# Patient Record
Sex: Male | Born: 1990
Health system: Southern US, Community
[De-identification: ages and names within clinical notes are randomized; demographics above are authoritative.]

## PROBLEM LIST (undated history)

## (undated) DIAGNOSIS — I10 Essential (primary) hypertension: Secondary | ICD-10-CM

## (undated) DIAGNOSIS — N390 Urinary tract infection, site not specified: Secondary | ICD-10-CM

## (undated) DIAGNOSIS — E119 Type 2 diabetes mellitus without complications: Secondary | ICD-10-CM

## (undated) DIAGNOSIS — W3400XA Accidental discharge from unspecified firearms or gun, initial encounter: Secondary | ICD-10-CM

## (undated) DIAGNOSIS — M24411 Recurrent dislocation, right shoulder: Secondary | ICD-10-CM

---
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
## 2011-04-15 ENCOUNTER — Emergency Department (HOSPITAL_COMMUNITY)
Admission: EM | Admit: 2011-04-15 | Discharge: 2011-04-15 | Disposition: A | Discharge status: Home / Self Care | Payer: No Typology Code available for payment source | Attending: Endocrinology | Admitting: Endocrinology

## 2011-04-15 DIAGNOSIS — S139XXA Sprain of joints and ligaments of unspecified parts of neck, initial encounter: Secondary | ICD-10-CM | POA: Insufficient documentation

## 2011-04-15 DIAGNOSIS — M549 Dorsalgia, unspecified: Secondary | ICD-10-CM | POA: Insufficient documentation

## 2011-04-15 DIAGNOSIS — M542 Cervicalgia: Secondary | ICD-10-CM | POA: Insufficient documentation

## 2012-08-31 ENCOUNTER — Emergency Department (HOSPITAL_COMMUNITY): Payer: Self-pay

## 2012-08-31 ENCOUNTER — Emergency Department (HOSPITAL_COMMUNITY)
Admission: EM | Admit: 2012-08-31 | Discharge: 2012-08-31 | Disposition: A | Discharge status: Home / Self Care | Payer: Self-pay | Attending: Emergency Medicine | Admitting: Emergency Medicine

## 2012-08-31 ENCOUNTER — Encounter (HOSPITAL_COMMUNITY): Payer: Self-pay | Admitting: Emergency Medicine

## 2012-08-31 DIAGNOSIS — S43006A Unspecified dislocation of unspecified shoulder joint, initial encounter: Secondary | ICD-10-CM

## 2012-08-31 DIAGNOSIS — F17200 Nicotine dependence, unspecified, uncomplicated: Secondary | ICD-10-CM | POA: Insufficient documentation

## 2012-08-31 DIAGNOSIS — Y9302 Activity, running: Secondary | ICD-10-CM | POA: Insufficient documentation

## 2012-08-31 DIAGNOSIS — Y929 Unspecified place or not applicable: Secondary | ICD-10-CM | POA: Insufficient documentation

## 2012-08-31 DIAGNOSIS — S00511A Abrasion of lip, initial encounter: Secondary | ICD-10-CM

## 2012-08-31 DIAGNOSIS — IMO0002 Reserved for concepts with insufficient information to code with codable children: Secondary | ICD-10-CM | POA: Insufficient documentation

## 2012-08-31 DIAGNOSIS — S4980XA Other specified injuries of shoulder and upper arm, unspecified arm, initial encounter: Secondary | ICD-10-CM | POA: Insufficient documentation

## 2012-08-31 DIAGNOSIS — S46909A Unspecified injury of unspecified muscle, fascia and tendon at shoulder and upper arm level, unspecified arm, initial encounter: Secondary | ICD-10-CM | POA: Insufficient documentation

## 2012-08-31 DIAGNOSIS — W1809XA Striking against other object with subsequent fall, initial encounter: Secondary | ICD-10-CM | POA: Insufficient documentation

## 2012-08-31 MED ORDER — PROPOFOL 10 MG/ML IV BOLUS
INTRAVENOUS | Status: AC | PRN
  Administered 2012-08-31: 40 mg via INTRAVENOUS
  Administered 2012-08-31: 90 mg via INTRAVENOUS

## 2012-08-31 MED ORDER — PROPOFOL 10 MG/ML IV BOLUS: 40.0000 mg | Freq: Once | INTRAVENOUS | Status: DC

## 2012-08-31 MED ORDER — HYDROCODONE-ACETAMINOPHEN 5-325 MG PO TABS: 2.0000 | ORAL_TABLET | ORAL | Status: DC | PRN

## 2012-08-31 MED ORDER — PROPOFOL 10 MG/ML IV BOLUS
1.0000 mg/kg | Freq: Once | INTRAVENOUS | Status: DC
  Filled 2012-08-31: qty 1

## 2012-08-31 MED ORDER — SODIUM CHLORIDE 0.9 % IV SOLN
INTRAVENOUS | Status: AC | PRN
  Administered 2012-08-31: 100 mL/h via INTRAVENOUS

## 2012-08-31 MED ORDER — FENTANYL CITRATE 0.05 MG/ML IJ SOLN
50.0000 ug | Freq: Once | INTRAMUSCULAR | Status: DC
  Filled 2012-08-31: qty 2
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
## 2012-10-04 ENCOUNTER — Emergency Department (HOSPITAL_COMMUNITY): Payer: Self-pay

## 2012-10-04 ENCOUNTER — Emergency Department (HOSPITAL_COMMUNITY)
Admission: EM | Admit: 2012-10-04 | Discharge: 2012-10-05 | Discharge status: Against Medical Advice | Payer: Self-pay | Attending: Emergency Medicine | Admitting: Emergency Medicine

## 2012-10-04 ENCOUNTER — Encounter (HOSPITAL_COMMUNITY): Payer: Self-pay | Admitting: Emergency Medicine

## 2012-10-04 DIAGNOSIS — Y9361 Activity, american tackle football: Secondary | ICD-10-CM | POA: Insufficient documentation

## 2012-10-04 DIAGNOSIS — F17200 Nicotine dependence, unspecified, uncomplicated: Secondary | ICD-10-CM | POA: Insufficient documentation

## 2012-10-04 DIAGNOSIS — S43006A Unspecified dislocation of unspecified shoulder joint, initial encounter: Secondary | ICD-10-CM | POA: Insufficient documentation

## 2012-10-04 DIAGNOSIS — X503XXA Overexertion from repetitive movements, initial encounter: Secondary | ICD-10-CM | POA: Insufficient documentation

## 2012-10-04 DIAGNOSIS — Z87828 Personal history of other (healed) physical injury and trauma: Secondary | ICD-10-CM | POA: Insufficient documentation

## 2012-10-04 DIAGNOSIS — I10 Essential (primary) hypertension: Secondary | ICD-10-CM | POA: Insufficient documentation

## 2012-10-04 DIAGNOSIS — M24411 Recurrent dislocation, right shoulder: Secondary | ICD-10-CM

## 2012-10-04 DIAGNOSIS — Y929 Unspecified place or not applicable: Secondary | ICD-10-CM | POA: Insufficient documentation

## 2012-10-04 HISTORY — DX: Essential (primary) hypertension: I10

## 2012-10-04 HISTORY — DX: Recurrent dislocation, right shoulder: M24.411

## 2012-10-04 NOTE — ED Notes (Signed)
Pt c/o of 8/10 right shoulder pain. Pt states that he has a history of shoulder dislocation and states it feels the same as the last time it came out. Upon standing, right arm hangs lower than the left. Right arm capillary refill is <3 seconds, ulnar and radial pulses are strong.

## 2012-10-05 ENCOUNTER — Encounter (HOSPITAL_COMMUNITY): Payer: Self-pay | Admitting: Emergency Medicine

## 2012-10-05 ENCOUNTER — Emergency Department (HOSPITAL_COMMUNITY): Payer: Self-pay

## 2012-10-05 MED ORDER — FENTANYL CITRATE 0.05 MG/ML IJ SOLN
50.0000 ug | INTRAMUSCULAR | Status: DC | PRN
  Administered 2012-10-05: 50 ug via INTRAVENOUS
  Filled 2012-10-05: qty 2

## 2012-10-05 MED ORDER — PROPOFOL 10 MG/ML IV BOLUS
0.5000 mg/kg | Freq: Once | INTRAVENOUS | Status: AC
  Administered 2012-10-05: 200 mg via INTRAVENOUS

## 2012-10-05 MED ORDER — SODIUM CHLORIDE 0.9 % IV SOLN: INTRAVENOUS | Status: DC

## 2012-10-05 MED ORDER — ONDANSETRON HCL 4 MG/2ML IJ SOLN
4.0000 mg | INTRAMUSCULAR | Status: DC | PRN
  Administered 2012-10-05: 4 mg via INTRAVENOUS
  Filled 2012-10-05: qty 2

## 2012-10-05 MED ORDER — PROPOFOL 10 MG/ML IV BOLUS
INTRAVENOUS | Status: AC
  Filled 2012-10-05: qty 1
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
## 2012-10-05 NOTE — ED Notes (Signed)
Pt not in room, gown laying on the bed; IV tubing and IV cath laying on bed; pt not in department or in the waiting room.

## 2013-01-11 ENCOUNTER — Emergency Department (HOSPITAL_COMMUNITY): Payer: BC Managed Care – PPO

## 2013-01-11 ENCOUNTER — Encounter (HOSPITAL_COMMUNITY): Payer: Self-pay | Admitting: Emergency Medicine

## 2013-01-11 ENCOUNTER — Emergency Department (HOSPITAL_COMMUNITY)
Admission: EM | Admit: 2013-01-11 | Discharge: 2013-01-11 | Disposition: A | Discharge status: Home / Self Care | Payer: BC Managed Care – PPO | Attending: Emergency Medicine | Admitting: Emergency Medicine

## 2013-01-11 DIAGNOSIS — Z8739 Personal history of other diseases of the musculoskeletal system and connective tissue: Secondary | ICD-10-CM | POA: Insufficient documentation

## 2013-01-11 DIAGNOSIS — IMO0001 Reserved for inherently not codable concepts without codable children: Secondary | ICD-10-CM | POA: Insufficient documentation

## 2013-01-11 DIAGNOSIS — S93409A Sprain of unspecified ligament of unspecified ankle, initial encounter: Secondary | ICD-10-CM | POA: Insufficient documentation

## 2013-01-11 DIAGNOSIS — Y92410 Unspecified street and highway as the place of occurrence of the external cause: Secondary | ICD-10-CM | POA: Insufficient documentation

## 2013-01-11 DIAGNOSIS — IMO0002 Reserved for concepts with insufficient information to code with codable children: Secondary | ICD-10-CM | POA: Insufficient documentation

## 2013-01-11 DIAGNOSIS — M549 Dorsalgia, unspecified: Secondary | ICD-10-CM

## 2013-01-11 DIAGNOSIS — I10 Essential (primary) hypertension: Secondary | ICD-10-CM | POA: Insufficient documentation

## 2013-01-11 DIAGNOSIS — F17200 Nicotine dependence, unspecified, uncomplicated: Secondary | ICD-10-CM | POA: Insufficient documentation

## 2013-01-11 DIAGNOSIS — Y939 Activity, unspecified: Secondary | ICD-10-CM | POA: Insufficient documentation

## 2013-01-11 MED ORDER — HYDROCODONE-ACETAMINOPHEN 5-325 MG PO TABS: 1.0000 | ORAL_TABLET | ORAL | Status: DC | PRN

## 2013-01-11 MED ORDER — HYDROCODONE-ACETAMINOPHEN 5-325 MG PO TABS
1.0000 | ORAL_TABLET | Freq: Once | ORAL | Status: AC
  Administered 2013-01-11: 1 via ORAL
  Filled 2013-01-11: qty 1

## 2013-01-11 NOTE — ED Notes (Signed)
Patient transported to X-ray 
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
## 2013-01-12 NOTE — ED Provider Notes (Signed)
Medical screening examination/treatment/procedure(s) were performed by non-physician practitioner and as supervising physician I was immediately available for consultation/collaboration.  Geoffery Lyons, MD 01/12/13 [phone]

## 2014-05-13 ENCOUNTER — Emergency Department (HOSPITAL_COMMUNITY): Payer: BC Managed Care – PPO

## 2014-05-13 ENCOUNTER — Emergency Department (HOSPITAL_COMMUNITY)
Admission: EM | Admit: 2014-05-13 | Discharge: 2014-05-14 | Disposition: A | Discharge status: Home / Self Care | Payer: BC Managed Care – PPO | Attending: Emergency Medicine | Admitting: Emergency Medicine

## 2014-05-13 ENCOUNTER — Encounter (HOSPITAL_COMMUNITY): Payer: Self-pay | Admitting: Emergency Medicine

## 2014-05-13 DIAGNOSIS — S199XXA Unspecified injury of neck, initial encounter: Secondary | ICD-10-CM | POA: Diagnosis not present

## 2014-05-13 DIAGNOSIS — F1410 Cocaine abuse, uncomplicated: Secondary | ICD-10-CM | POA: Diagnosis not present

## 2014-05-13 DIAGNOSIS — Z8739 Personal history of other diseases of the musculoskeletal system and connective tissue: Secondary | ICD-10-CM | POA: Diagnosis not present

## 2014-05-13 DIAGNOSIS — F1010 Alcohol abuse, uncomplicated: Secondary | ICD-10-CM | POA: Diagnosis not present

## 2014-05-13 DIAGNOSIS — F1210 Cannabis abuse, uncomplicated: Secondary | ICD-10-CM | POA: Insufficient documentation

## 2014-05-13 DIAGNOSIS — I10 Essential (primary) hypertension: Secondary | ICD-10-CM | POA: Diagnosis not present

## 2014-05-13 DIAGNOSIS — F17200 Nicotine dependence, unspecified, uncomplicated: Secondary | ICD-10-CM | POA: Diagnosis not present

## 2014-05-13 DIAGNOSIS — F10920 Alcohol use, unspecified with intoxication, uncomplicated: Secondary | ICD-10-CM

## 2014-05-13 DIAGNOSIS — IMO0002 Reserved for concepts with insufficient information to code with codable children: Secondary | ICD-10-CM | POA: Insufficient documentation

## 2014-05-13 DIAGNOSIS — N179 Acute kidney failure, unspecified: Secondary | ICD-10-CM | POA: Diagnosis not present

## 2014-05-13 DIAGNOSIS — Y92410 Unspecified street and highway as the place of occurrence of the external cause: Secondary | ICD-10-CM | POA: Diagnosis not present

## 2014-05-13 DIAGNOSIS — S0993XA Unspecified injury of face, initial encounter: Secondary | ICD-10-CM | POA: Diagnosis not present

## 2014-05-13 DIAGNOSIS — R404 Transient alteration of awareness: Secondary | ICD-10-CM | POA: Insufficient documentation

## 2014-05-13 DIAGNOSIS — Y9389 Activity, other specified: Secondary | ICD-10-CM | POA: Diagnosis not present

## 2014-05-13 DIAGNOSIS — F1910 Other psychoactive substance abuse, uncomplicated: Secondary | ICD-10-CM

## 2014-05-13 DIAGNOSIS — S0990XA Unspecified injury of head, initial encounter: Secondary | ICD-10-CM | POA: Diagnosis present

## 2014-05-13 DIAGNOSIS — R000 Tachycardia, unspecified: Secondary | ICD-10-CM | POA: Insufficient documentation

## 2014-05-13 DIAGNOSIS — T1490XA Injury, unspecified, initial encounter: Secondary | ICD-10-CM

## 2014-05-13 DIAGNOSIS — S0091XA Abrasion of unspecified part of head, initial encounter: Secondary | ICD-10-CM

## 2014-05-13 LAB — CBC
HEMATOCRIT: 45.3 % (ref 39.0–52.0)
Hemoglobin: 15.4 g/dL (ref 13.0–17.0)
MCH: 30.7 pg (ref 26.0–34.0)
MCHC: 34 g/dL (ref 30.0–36.0)
MCV: 90.2 fL (ref 78.0–100.0)
Platelets: 234 10*3/uL (ref 150–400)
RBC: 5.02 MIL/uL (ref 4.22–5.81)
RDW: 13.2 % (ref 11.5–15.5)
WBC: 5.1 10*3/uL (ref 4.0–10.5)

## 2014-05-13 LAB — SAMPLE TO BLOOD BANK

## 2014-05-13 LAB — COMPREHENSIVE METABOLIC PANEL
ALBUMIN: 3.8 g/dL (ref 3.5–5.2)
ALK PHOS: 58 U/L (ref 39–117)
ALT: 42 U/L (ref 0–53)
AST: 69 U/L — ABNORMAL HIGH (ref 0–37)
Anion gap: 13 (ref 5–15)
BUN: 7 mg/dL (ref 6–23)
CO2: 23 mEq/L (ref 19–32)
Calcium: 9.3 mg/dL (ref 8.4–10.5)
Chloride: 108 mEq/L (ref 96–112)
Creatinine, Ser: 1.42 mg/dL — ABNORMAL HIGH (ref 0.50–1.35)
GFR calc Af Amer: 80 mL/min — ABNORMAL LOW (ref >90)
GFR calc non Af Amer: 69 mL/min — ABNORMAL LOW (ref >90)
Glucose, Bld: 100 mg/dL — ABNORMAL HIGH (ref 70–99)
POTASSIUM: 3.9 meq/L (ref 3.7–5.3)
Sodium: 144 mEq/L (ref 137–147)
TOTAL PROTEIN: 7.3 g/dL (ref 6.0–8.3)
Total Bilirubin: 0.2 mg/dL — ABNORMAL LOW (ref 0.3–1.2)

## 2014-05-13 LAB — RAPID URINE DRUG SCREEN, HOSP PERFORMED
Amphetamines: NOT DETECTED
Barbiturates: NOT DETECTED
Benzodiazepines: NOT DETECTED
Cocaine: POSITIVE — AB
Opiates: NOT DETECTED
Tetrahydrocannabinol: POSITIVE — AB

## 2014-05-13 LAB — PROTIME-INR
INR: 0.96 (ref 0.00–1.49)
Prothrombin Time: 12.8 seconds (ref 11.6–15.2)

## 2014-05-13 LAB — I-STAT CG4 LACTIC ACID, ED: LACTIC ACID, VENOUS: 1.41 mmol/L (ref 0.5–2.2)

## 2014-05-13 LAB — LIPASE, BLOOD: LIPASE: 38 U/L (ref 11–59)

## 2014-05-13 LAB — ETHANOL: Alcohol, Ethyl (B): 136 mg/dL — ABNORMAL HIGH (ref 0–11)

## 2014-05-13 MED ORDER — SODIUM CHLORIDE 0.9 % IV BOLUS (SEPSIS)
1000.0000 mL | Freq: Once | INTRAVENOUS | Status: AC
  Administered 2014-05-13: 1000 mL via INTRAVENOUS

## 2014-05-13 MED ORDER — KETOROLAC TROMETHAMINE 30 MG/ML IJ SOLN
30.0000 mg | Freq: Once | INTRAMUSCULAR | Status: DC
  Filled 2014-05-13: qty 1
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
## 2014-05-13 NOTE — Discharge Instructions (Signed)
Alcohol Intoxication °Alcohol intoxication occurs when you drink enough alcohol that it affects your ability to function. It can be mild or very severe. Drinking a lot of alcohol in a short time is called binge drinking. This can be very harmful. Drinking alcohol can also be more dangerous if you are taking medicines or other drugs. Some of the effects caused by alcohol may include: °· Loss of coordination. °· Changes in mood and behavior. °· Unclear thinking. °· Trouble talking (slurred speech). °· Throwing up (vomiting). °· Confusion. °· Slowed breathing. °· Twitching and shaking (seizures). °· Loss of consciousness. °HOME CARE °· Do not drive after drinking alcohol. °· Drink enough water and fluids to keep your pee (urine) clear or pale yellow. Avoid caffeine. °· Only take medicine as told by your doctor. °GET HELP IF: °· You throw up (vomit) many times. °· You do not feel better after a few days. °· You frequently have alcohol intoxication. Your doctor can help decide if you should see a substance use treatment counselor. °GET HELP RIGHT AWAY IF: °· You become shaky when you stop drinking. °· You have twitching and shaking. °· You throw up blood. It may look bright red or like coffee grounds. °· You notice blood in your poop (bowel movements). °· You become lightheaded or pass out (faint). °MAKE SURE YOU:  °· Understand these instructions. °· Will watch your condition. °· Will get help right away if you are not doing well or get worse. °Document Released: 02/07/2008 Document Revised: 04/23/2013 Document Reviewed: 01/24/2013 °[employer]® Patient Information ©2015 [employer]. This information is not intended to replace advice given to you by your health care provider. Make sure you discuss any questions you have with your health care provider. ° °
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
## 2014-05-13 NOTE — ED Notes (Addendum)
This RN went into the room with Craig Circle, MD for assessment of the pt and to update the family. Pt hard to rouse. When his name called and shaken, pt will grumble and roll over. Painful stimuli applied to have the pt be fully alert to answer questions.
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
## 2014-05-14 NOTE — ED Notes (Signed)
Pt's sister to come pick him up. Pt to stay in room until her arrival. Pt is alert and oriented at this time, and is aware of the plan.

## 2017-01-16 ENCOUNTER — Emergency Department (HOSPITAL_COMMUNITY): Payer: Medicaid Other

## 2017-01-16 ENCOUNTER — Inpatient Hospital Stay (HOSPITAL_COMMUNITY)
Admission: EM | Admit: 2017-01-16 | Discharge: 2017-01-29 | DRG: 957 | Disposition: A | Discharge status: Rehabilitation Facility | Payer: Medicaid Other | Attending: General Surgery | Admitting: General Surgery

## 2017-01-16 ENCOUNTER — Encounter (HOSPITAL_COMMUNITY)
Admission: EM | Disposition: A | Discharge status: Rehabilitation Facility | Payer: Self-pay | Source: Home / Self Care | Attending: Urology

## 2017-01-16 ENCOUNTER — Emergency Department (HOSPITAL_COMMUNITY): Payer: Medicaid Other | Admitting: Certified Registered Nurse Anesthetist

## 2017-01-16 DIAGNOSIS — J9600 Acute respiratory failure, unspecified whether with hypoxia or hypercapnia: Secondary | ICD-10-CM | POA: Diagnosis present

## 2017-01-16 DIAGNOSIS — S36039A Unspecified laceration of spleen, initial encounter: Secondary | ICD-10-CM | POA: Diagnosis present

## 2017-01-16 DIAGNOSIS — D62 Acute posthemorrhagic anemia: Secondary | ICD-10-CM | POA: Diagnosis present

## 2017-01-16 DIAGNOSIS — S24104A Unspecified injury at T11-T12 level of thoracic spinal cord, initial encounter: Secondary | ICD-10-CM | POA: Diagnosis present

## 2017-01-16 DIAGNOSIS — R579 Shock, unspecified: Secondary | ICD-10-CM | POA: Diagnosis present

## 2017-01-16 DIAGNOSIS — F17210 Nicotine dependence, cigarettes, uncomplicated: Secondary | ICD-10-CM | POA: Diagnosis present

## 2017-01-16 DIAGNOSIS — G8220 Paraplegia, unspecified: Secondary | ICD-10-CM | POA: Diagnosis present

## 2017-01-16 DIAGNOSIS — E46 Unspecified protein-calorie malnutrition: Secondary | ICD-10-CM | POA: Diagnosis present

## 2017-01-16 DIAGNOSIS — R7989 Other specified abnormal findings of blood chemistry: Secondary | ICD-10-CM | POA: Diagnosis present

## 2017-01-16 DIAGNOSIS — K5900 Constipation, unspecified: Secondary | ICD-10-CM | POA: Diagnosis not present

## 2017-01-16 DIAGNOSIS — G8100 Flaccid hemiplegia affecting unspecified side: Secondary | ICD-10-CM

## 2017-01-16 DIAGNOSIS — N179 Acute kidney failure, unspecified: Secondary | ICD-10-CM | POA: Diagnosis present

## 2017-01-16 DIAGNOSIS — S299XXA Unspecified injury of thorax, initial encounter: Secondary | ICD-10-CM

## 2017-01-16 DIAGNOSIS — R05 Cough: Secondary | ICD-10-CM

## 2017-01-16 DIAGNOSIS — S36113A Laceration of liver, unspecified degree, initial encounter: Secondary | ICD-10-CM | POA: Diagnosis present

## 2017-01-16 DIAGNOSIS — F329 Major depressive disorder, single episode, unspecified: Secondary | ICD-10-CM | POA: Diagnosis present

## 2017-01-16 DIAGNOSIS — S41031A Puncture wound without foreign body of right shoulder, initial encounter: Secondary | ICD-10-CM | POA: Diagnosis present

## 2017-01-16 DIAGNOSIS — Z6830 Body mass index (BMI) 30.0-30.9, adult: Secondary | ICD-10-CM

## 2017-01-16 DIAGNOSIS — S24109A Unspecified injury at unspecified level of thoracic spinal cord, initial encounter: Secondary | ICD-10-CM | POA: Diagnosis present

## 2017-01-16 DIAGNOSIS — S2241XA Multiple fractures of ribs, right side, initial encounter for closed fracture: Secondary | ICD-10-CM | POA: Diagnosis present

## 2017-01-16 DIAGNOSIS — S37032A Laceration of left kidney, unspecified degree, initial encounter: Secondary | ICD-10-CM | POA: Diagnosis present

## 2017-01-16 DIAGNOSIS — S37012A Minor contusion of left kidney, initial encounter: Secondary | ICD-10-CM | POA: Diagnosis present

## 2017-01-16 DIAGNOSIS — E871 Hypo-osmolality and hyponatremia: Secondary | ICD-10-CM | POA: Diagnosis present

## 2017-01-16 DIAGNOSIS — Y906 Blood alcohol level of 120-199 mg/100 ml: Secondary | ICD-10-CM | POA: Diagnosis present

## 2017-01-16 DIAGNOSIS — N319 Neuromuscular dysfunction of bladder, unspecified: Secondary | ICD-10-CM | POA: Diagnosis present

## 2017-01-16 DIAGNOSIS — M792 Neuralgia and neuritis, unspecified: Secondary | ICD-10-CM

## 2017-01-16 DIAGNOSIS — W3400XA Accidental discharge from unspecified firearms or gun, initial encounter: Secondary | ICD-10-CM | POA: Diagnosis not present

## 2017-01-16 DIAGNOSIS — S31139A Puncture wound of abdominal wall without foreign body, unspecified quadrant without penetration into peritoneal cavity, initial encounter: Secondary | ICD-10-CM | POA: Diagnosis present

## 2017-01-16 DIAGNOSIS — J90 Pleural effusion, not elsewhere classified: Secondary | ICD-10-CM | POA: Diagnosis present

## 2017-01-16 DIAGNOSIS — S41032A Puncture wound without foreign body of left shoulder, initial encounter: Secondary | ICD-10-CM | POA: Diagnosis present

## 2017-01-16 DIAGNOSIS — S31609A Unspecified open wound of abdominal wall, unspecified quadrant with penetration into peritoneal cavity, initial encounter: Secondary | ICD-10-CM | POA: Diagnosis present

## 2017-01-16 DIAGNOSIS — J942 Hemothorax: Secondary | ICD-10-CM | POA: Diagnosis present

## 2017-01-16 DIAGNOSIS — R390 Extravasation of urine: Secondary | ICD-10-CM | POA: Diagnosis present

## 2017-01-16 DIAGNOSIS — J9383 Other pneumothorax: Secondary | ICD-10-CM | POA: Diagnosis present

## 2017-01-16 DIAGNOSIS — S2232XA Fracture of one rib, left side, initial encounter for closed fracture: Secondary | ICD-10-CM | POA: Diagnosis present

## 2017-01-16 DIAGNOSIS — R079 Chest pain, unspecified: Secondary | ICD-10-CM | POA: Diagnosis present

## 2017-01-16 DIAGNOSIS — J939 Pneumothorax, unspecified: Secondary | ICD-10-CM

## 2017-01-16 DIAGNOSIS — R059 Cough, unspecified: Secondary | ICD-10-CM

## 2017-01-16 DIAGNOSIS — Z9889 Other specified postprocedural states: Secondary | ICD-10-CM

## 2017-01-16 DIAGNOSIS — Z978 Presence of other specified devices: Secondary | ICD-10-CM

## 2017-01-16 DIAGNOSIS — S42111A Displaced fracture of body of scapula, right shoulder, initial encounter for closed fracture: Secondary | ICD-10-CM | POA: Diagnosis present

## 2017-01-16 DIAGNOSIS — G629 Polyneuropathy, unspecified: Secondary | ICD-10-CM | POA: Diagnosis present

## 2017-01-16 DIAGNOSIS — S37001S Unspecified injury of right kidney, sequela: Secondary | ICD-10-CM

## 2017-01-16 DIAGNOSIS — G8918 Other acute postprocedural pain: Secondary | ICD-10-CM

## 2017-01-16 DIAGNOSIS — F1010 Alcohol abuse, uncomplicated: Secondary | ICD-10-CM | POA: Diagnosis present

## 2017-01-16 DIAGNOSIS — E8809 Other disorders of plasma-protein metabolism, not elsewhere classified: Secondary | ICD-10-CM

## 2017-01-16 DIAGNOSIS — G8389 Other specified paralytic syndromes: Secondary | ICD-10-CM

## 2017-01-16 DIAGNOSIS — D72829 Elevated white blood cell count, unspecified: Secondary | ICD-10-CM | POA: Diagnosis present

## 2017-01-16 DIAGNOSIS — R310 Gross hematuria: Secondary | ICD-10-CM | POA: Diagnosis present

## 2017-01-16 HISTORY — PX: LAPAROTOMY: SHX154

## 2017-01-16 LAB — I-STAT CHEM 8, ED
BUN: 9 mg/dL (ref 6–20)
Calcium, Ion: 1.09 mmol/L — ABNORMAL LOW (ref 1.15–1.40)
Chloride: 110 mmol/L (ref 101–111)
Creatinine, Ser: 1.9 mg/dL — ABNORMAL HIGH (ref 0.61–1.24)
Glucose, Bld: 118 mg/dL — ABNORMAL HIGH (ref 65–99)
HCT: 43 % (ref 39.0–52.0)
Hemoglobin: 14.6 g/dL (ref 13.0–17.0)
Potassium: 3.6 mmol/L (ref 3.5–5.1)
SODIUM: 146 mmol/L — AB (ref 135–145)
TCO2: 20 mmol/L (ref 0–100)

## 2017-01-16 LAB — CBC
HCT: 41.6 % (ref 39.0–52.0)
HEMATOCRIT: 40.7 % (ref 39.0–52.0)
HEMATOCRIT: 35.8 % — AB (ref 39.0–52.0)
HEMOGLOBIN: 13.8 g/dL (ref 13.0–17.0)
Hemoglobin: 13.2 g/dL (ref 13.0–17.0)
Hemoglobin: 11.7 g/dL — ABNORMAL LOW (ref 13.0–17.0)
MCH: 30.5 pg (ref 26.0–34.0)
MCH: 29.8 pg (ref 26.0–34.0)
MCH: 29.3 pg (ref 26.0–34.0)
MCHC: 33.2 g/dL (ref 30.0–36.0)
MCHC: 32.4 g/dL (ref 30.0–36.0)
MCHC: 32.7 g/dL (ref 30.0–36.0)
MCV: 91.8 fL (ref 78.0–100.0)
MCV: 91.9 fL (ref 78.0–100.0)
MCV: 89.5 fL (ref 78.0–100.0)
PLATELETS: 253 10*3/uL (ref 150–400)
PLATELETS: 193 10*3/uL (ref 150–400)
Platelets: 195 10*3/uL (ref 150–400)
RBC: 4.53 MIL/uL (ref 4.22–5.81)
RBC: 4.43 MIL/uL (ref 4.22–5.81)
RBC: 4 MIL/uL — AB (ref 4.22–5.81)
RDW: 14.8 % (ref 11.5–15.5)
RDW: 15.2 % (ref 11.5–15.5)
RDW: 15.1 % (ref 11.5–15.5)
WBC: 6.1 10*3/uL (ref 4.0–10.5)
WBC: 26.1 10*3/uL — AB (ref 4.0–10.5)
WBC: 14.3 10*3/uL — AB (ref 4.0–10.5)

## 2017-01-16 LAB — COMPREHENSIVE METABOLIC PANEL
ALBUMIN: 2.7 g/dL — AB (ref 3.5–5.0)
ALK PHOS: 60 U/L (ref 38–126)
ALT: 71 U/L — ABNORMAL HIGH (ref 17–63)
ALT: 111 U/L — AB (ref 17–63)
ANION GAP: 12 (ref 5–15)
AST: 88 U/L — ABNORMAL HIGH (ref 15–41)
AST: 145 U/L — AB (ref 15–41)
Albumin: 3.7 g/dL (ref 3.5–5.0)
Alkaline Phosphatase: 45 U/L (ref 38–126)
Anion gap: 8 (ref 5–15)
BILIRUBIN TOTAL: 0.5 mg/dL (ref 0.3–1.2)
BUN: 9 mg/dL (ref 6–20)
BUN: 10 mg/dL (ref 6–20)
CALCIUM: 8.6 mg/dL — AB (ref 8.9–10.3)
CHLORIDE: 115 mmol/L — AB (ref 101–111)
CO2: 18 mmol/L — ABNORMAL LOW (ref 22–32)
CO2: 16 mmol/L — ABNORMAL LOW (ref 22–32)
CREATININE: 1.73 mg/dL — AB (ref 0.61–1.24)
CREATININE: 1.37 mg/dL — AB (ref 0.61–1.24)
Calcium: 7 mg/dL — ABNORMAL LOW (ref 8.9–10.3)
Chloride: 112 mmol/L — ABNORMAL HIGH (ref 101–111)
GFR calc Af Amer: >60 mL/min (ref >60)
GFR calc Af Amer: >60 mL/min (ref >60)
GFR calc non Af Amer: 53 mL/min — ABNORMAL LOW (ref >60)
GLUCOSE: 126 mg/dL — AB (ref 65–99)
Glucose, Bld: 110 mg/dL — ABNORMAL HIGH (ref 65–99)
POTASSIUM: 5.6 mmol/L — AB (ref 3.5–5.1)
Potassium: 3.5 mmol/L (ref 3.5–5.1)
Sodium: 142 mmol/L (ref 135–145)
Sodium: 139 mmol/L (ref 135–145)
TOTAL PROTEIN: 6.4 g/dL — AB (ref 6.5–8.1)
Total Bilirubin: 0.8 mg/dL (ref 0.3–1.2)
Total Protein: 4.7 g/dL — ABNORMAL LOW (ref 6.5–8.1)

## 2017-01-16 LAB — BPAM PLATELET PHERESIS
BLOOD PRODUCT EXPIRATION DATE: 201805172359
ISSUE DATE / TIME: 201805151828
Unit Type and Rh: 5100

## 2017-01-16 LAB — POCT I-STAT 3, ART BLOOD GAS (G3+)
Acid-base deficit: 8 mmol/L — ABNORMAL HIGH (ref 0.0–2.0)
BICARBONATE: 17.4 mmol/L — AB (ref 20.0–28.0)
O2 Saturation: 100 %
PCO2 ART: 35.2 mmHg (ref 32.0–48.0)
PH ART: 7.302 — AB (ref 7.350–7.450)
TCO2: 18 mmol/L (ref 0–100)
pO2, Arterial: 409 mmHg — ABNORMAL HIGH (ref 83.0–108.0)

## 2017-01-16 LAB — PROTIME-INR
INR: 1.11
INR: 1.24
Prothrombin Time: 14.3 seconds (ref 11.4–15.2)
Prothrombin Time: 15.7 seconds — ABNORMAL HIGH (ref 11.4–15.2)

## 2017-01-16 LAB — I-STAT CG4 LACTIC ACID, ED: LACTIC ACID, VENOUS: 4.14 mmol/L — AB (ref 0.5–1.9)

## 2017-01-16 LAB — CDS SEROLOGY

## 2017-01-16 LAB — PREPARE PLATELET PHERESIS: Unit division: 0

## 2017-01-16 LAB — FIBRINOGEN: Fibrinogen: 232 mg/dL (ref 210–475)

## 2017-01-16 LAB — ETHANOL: ALCOHOL ETHYL (B): 141 mg/dL — AB (ref <5)

## 2017-01-16 LAB — LACTIC ACID, PLASMA: Lactic Acid, Venous: 2.9 mmol/L (ref 0.5–1.9)

## 2017-01-16 LAB — GLUCOSE, CAPILLARY: Glucose-Capillary: 113 mg/dL — ABNORMAL HIGH (ref 65–99)

## 2017-01-16 LAB — MRSA PCR SCREENING: MRSA by PCR: NEGATIVE

## 2017-01-16 LAB — ABO/RH: ABO/RH(D): A POS

## 2017-01-16 LAB — TRIGLYCERIDES: TRIGLYCERIDES: 199 mg/dL — AB (ref <150)

## 2017-01-16 SURGERY — LAPAROTOMY, EXPLORATORY
Anesthesia: General | Site: Abdomen

## 2017-01-16 MED ORDER — 0.9 % SODIUM CHLORIDE (POUR BTL) OPTIME
TOPICAL | Status: DC | PRN
  Administered 2017-01-16 (×6): 1000 mL

## 2017-01-16 MED ORDER — DOCUSATE SODIUM 50 MG/5ML PO LIQD: 100.0000 mg | Freq: Two times a day (BID) | ORAL | Status: DC | PRN

## 2017-01-16 MED ORDER — FENTANYL CITRATE (PF) 250 MCG/5ML IJ SOLN
INTRAMUSCULAR | Status: DC | PRN
  Administered 2017-01-16 (×2): 100 ug via INTRAVENOUS
  Administered 2017-01-16: 50 ug via INTRAVENOUS

## 2017-01-16 MED ORDER — PROPOFOL 10 MG/ML IV BOLUS
INTRAVENOUS | Status: AC
  Filled 2017-01-16: qty 20

## 2017-01-16 MED ORDER — TETANUS-DIPHTH-ACELL PERTUSSIS 5-2.5-18.5 LF-MCG/0.5 IM SUSP
INTRAMUSCULAR | Status: AC
  Filled 2017-01-16: qty 0.5

## 2017-01-16 MED ORDER — CHLORHEXIDINE GLUCONATE 0.12% ORAL RINSE (MEDLINE KIT)
15.0000 mL | Freq: Two times a day (BID) | OROMUCOSAL | Status: DC
  Administered 2017-01-16 – 2017-01-17 (×2): 15 mL via OROMUCOSAL

## 2017-01-16 MED ORDER — PROPOFOL 1000 MG/100ML IV EMUL
INTRAVENOUS | Status: AC
  Administered 2017-01-16: 50 ug/kg/min
  Filled 2017-01-16: qty 100

## 2017-01-16 MED ORDER — CHLORHEXIDINE GLUCONATE 0.12% ORAL RINSE (MEDLINE KIT)
15.0000 mL | Freq: Two times a day (BID) | OROMUCOSAL | Status: DC
  Administered 2017-01-16: 15 mL via OROMUCOSAL

## 2017-01-16 MED ORDER — FENTANYL CITRATE (PF) 100 MCG/2ML IJ SOLN
INTRAMUSCULAR | Status: DC | PRN
  Administered 2017-01-16 (×2): 50 ug via INTRAVENOUS

## 2017-01-16 MED ORDER — FENTANYL 2500MCG IN NS 250ML (10MCG/ML) PREMIX INFUSION
25.0000 ug/h | INTRAVENOUS | Status: DC
  Administered 2017-01-16: 25 ug/h via INTRAVENOUS
  Filled 2017-01-16: qty 250

## 2017-01-16 MED ORDER — LIDOCAINE-EPINEPHRINE (PF) 2 %-1:200000 IJ SOLN
INTRAMUSCULAR | Status: AC
  Filled 2017-01-16: qty 20

## 2017-01-16 MED ORDER — ORAL CARE MOUTH RINSE: 15.0000 mL | Freq: Four times a day (QID) | OROMUCOSAL | Status: DC

## 2017-01-16 MED ORDER — FENTANYL BOLUS VIA INFUSION
50.0000 ug | INTRAVENOUS | Status: DC | PRN
  Filled 2017-01-16: qty 50

## 2017-01-16 MED ORDER — FENTANYL CITRATE (PF) 250 MCG/5ML IJ SOLN
INTRAMUSCULAR | Status: AC
  Filled 2017-01-16: qty 5

## 2017-01-16 MED ORDER — IOPAMIDOL (ISOVUE-300) INJECTION 61%
INTRAVENOUS | Status: AC
  Administered 2017-01-17: 100 mL
  Filled 2017-01-16: qty 100

## 2017-01-16 MED ORDER — PROPOFOL 1000 MG/100ML IV EMUL
0.0000 ug/kg/min | INTRAVENOUS | Status: DC
  Administered 2017-01-17: 40 ug/kg/min via INTRAVENOUS
  Filled 2017-01-16 (×2): qty 100

## 2017-01-16 MED ORDER — ROCURONIUM BROMIDE 10 MG/ML (PF) SYRINGE
PREFILLED_SYRINGE | INTRAVENOUS | Status: DC | PRN
  Administered 2017-01-16 (×4): 50 mg via INTRAVENOUS

## 2017-01-16 MED ORDER — PROPOFOL 1000 MG/100ML IV EMUL
INTRAVENOUS | Status: AC
  Filled 2017-01-16: qty 100

## 2017-01-16 MED ORDER — ETOMIDATE 2 MG/ML IV SOLN
INTRAVENOUS | Status: DC | PRN
  Administered 2017-01-16: 20 mg via INTRAVENOUS

## 2017-01-16 MED ORDER — ONDANSETRON HCL 4 MG PO TABS: 4.0000 mg | ORAL_TABLET | Freq: Four times a day (QID) | ORAL | Status: DC | PRN

## 2017-01-16 MED ORDER — MIDAZOLAM HCL 2 MG/2ML IJ SOLN
INTRAMUSCULAR | Status: AC
  Filled 2017-01-16: qty 4

## 2017-01-16 MED ORDER — MIDAZOLAM HCL 5 MG/5ML IJ SOLN
INTRAMUSCULAR | Status: DC | PRN
Start: 2017-01-16 — End: 2017-01-17
  Administered 2017-01-16 (×2): 2 mg via INTRAVENOUS

## 2017-01-16 MED ORDER — SUCCINYLCHOLINE CHLORIDE 20 MG/ML IJ SOLN
INTRAMUSCULAR | Status: DC | PRN
  Administered 2017-01-16: 105 mg via INTRAVENOUS

## 2017-01-16 MED ORDER — PANTOPRAZOLE SODIUM 40 MG IV SOLR
40.0000 mg | Freq: Every day | INTRAVENOUS | Status: DC
  Administered 2017-01-16 – 2017-01-17 (×2): 40 mg via INTRAVENOUS
  Filled 2017-01-16 (×2): qty 40

## 2017-01-16 MED ORDER — LACTATED RINGERS IV SOLN
INTRAVENOUS | Status: DC | PRN
  Administered 2017-01-16 (×2): via INTRAVENOUS

## 2017-01-16 MED ORDER — IOPAMIDOL (ISOVUE-300) INJECTION 61%
INTRAVENOUS | Status: AC
  Filled 2017-01-16: qty 100

## 2017-01-16 MED ORDER — CEFAZOLIN SODIUM-DEXTROSE 1-4 GM/50ML-% IV SOLN
1.0000 g | Freq: Three times a day (TID) | INTRAVENOUS | Status: DC
  Administered 2017-01-16 – 2017-01-18 (×5): 1 g via INTRAVENOUS
  Filled 2017-01-16 (×6): qty 50

## 2017-01-16 MED ORDER — FENTANYL CITRATE (PF) 100 MCG/2ML IJ SOLN
50.0000 ug | Freq: Once | INTRAMUSCULAR | Status: AC
  Administered 2017-01-16: 50 ug via INTRAVENOUS

## 2017-01-16 MED ORDER — ONDANSETRON HCL 4 MG/2ML IJ SOLN: 4.0000 mg | Freq: Four times a day (QID) | INTRAMUSCULAR | Status: DC | PRN

## 2017-01-16 MED ORDER — LACTATED RINGERS IV SOLN
INTRAVENOUS | Status: DC | PRN
  Administered 2017-01-16: 18:00:00 via INTRAVENOUS

## 2017-01-16 MED ORDER — ORAL CARE MOUTH RINSE
15.0000 mL | OROMUCOSAL | Status: DC
  Administered 2017-01-17 (×7): 15 mL via OROMUCOSAL

## 2017-01-16 MED ORDER — FENTANYL CITRATE (PF) 100 MCG/2ML IJ SOLN
INTRAMUSCULAR | Status: AC
  Filled 2017-01-16: qty 2

## 2017-01-16 MED ORDER — SODIUM CHLORIDE 0.9 % IV SOLN
INTRAVENOUS | Status: DC
  Administered 2017-01-16 – 2017-01-28 (×14): via INTRAVENOUS
  Administered 2017-01-29: 1 mL via INTRAVENOUS

## 2017-01-16 MED ORDER — PANTOPRAZOLE SODIUM 40 MG PO TBEC
40.0000 mg | DELAYED_RELEASE_TABLET | Freq: Every day | ORAL | Status: DC
  Administered 2017-01-18 – 2017-01-29 (×11): 40 mg via ORAL
  Filled 2017-01-16 (×11): qty 1

## 2017-01-16 SURGICAL SUPPLY — 56 items
BIOPATCH RED 1 DISK 7.0 (GAUZE/BANDAGES/DRESSINGS) ×1 IMPLANT
BIOPATCH RED 1IN DISK 7.0MM (GAUZE/BANDAGES/DRESSINGS) ×1
BLADE CLIPPER SURG (BLADE) ×2 IMPLANT
CANISTER SUCT 3000ML PPV (MISCELLANEOUS) ×1 IMPLANT
CHLORAPREP W/TINT 26ML (MISCELLANEOUS) ×1 IMPLANT
CLIP TI LARGE 6 (CLIP) ×2 IMPLANT
CLIP TI MEDIUM 6 (CLIP) ×2 IMPLANT
COVER SURGICAL LIGHT HANDLE (MISCELLANEOUS) ×3 IMPLANT
DRAIN CHANNEL 19F RND (DRAIN) ×2 IMPLANT
DRAPE LAPAROSCOPIC ABDOMINAL (DRAPES) ×1 IMPLANT
DRAPE LAPAROTOMY 100X72X124 (DRAPES) ×2 IMPLANT
DRAPE WARM FLUID 44X44 (DRAPE) ×3 IMPLANT
DRSG COVADERM 4X14 (GAUZE/BANDAGES/DRESSINGS) ×2 IMPLANT
DRSG OPSITE POSTOP 4X10 (GAUZE/BANDAGES/DRESSINGS) IMPLANT
DRSG OPSITE POSTOP 4X8 (GAUZE/BANDAGES/DRESSINGS) IMPLANT
ELECT BLADE 6.5 EXT (BLADE) ×4 IMPLANT
ELECT CAUTERY BLADE 6.4 (BLADE) ×6 IMPLANT
ELECT REM PT RETURN 9FT ADLT (ELECTROSURGICAL) ×3
ELECTRODE REM PT RTRN 9FT ADLT (ELECTROSURGICAL) ×1 IMPLANT
EVACUATOR SILICONE 100CC (DRAIN) ×2 IMPLANT
GAUZE SPONGE 4X4 12PLY STRL (GAUZE/BANDAGES/DRESSINGS) ×8 IMPLANT
GLOVE BIO SURGEON STRL SZ7 (GLOVE) ×3 IMPLANT
GLOVE BIOGEL PI IND STRL 7.5 (GLOVE) ×1 IMPLANT
GLOVE BIOGEL PI INDICATOR 7.5 (GLOVE) ×2
GOWN SPEC L3 XXLG W/TWL (GOWN DISPOSABLE) ×2 IMPLANT
GOWN SPEC L4 XLG W/TWL (GOWN DISPOSABLE) ×2 IMPLANT
GOWN STRL REUS W/ TWL LRG LVL3 (GOWN DISPOSABLE) ×2 IMPLANT
GOWN STRL REUS W/TWL LRG LVL3 (GOWN DISPOSABLE) ×6
HEMOSTAT SURGICEL 2X14 (HEMOSTASIS) ×4 IMPLANT
KIT BASIN OR (CUSTOM PROCEDURE TRAY) ×3 IMPLANT
KIT ROOM TURNOVER OR (KITS) ×3 IMPLANT
LIGASURE IMPACT 36 18CM CVD LR (INSTRUMENTS) IMPLANT
MANIFOLD NEPTUNE II (INSTRUMENTS) ×2 IMPLANT
NS IRRIG 1000ML POUR BTL (IV SOLUTION) ×14 IMPLANT
PACK GENERAL/GYN (CUSTOM PROCEDURE TRAY) ×3 IMPLANT
PAD ARMBOARD 7.5X6 YLW CONV (MISCELLANEOUS) ×3 IMPLANT
SPECIMEN JAR LARGE (MISCELLANEOUS) IMPLANT
SPONGE LAP 18X18 X RAY DECT (DISPOSABLE) ×4 IMPLANT
STAPLER VISISTAT 35W (STAPLE) ×3 IMPLANT
SUCTION POOLE TIP (SUCTIONS) ×1 IMPLANT
SUT ETHILON 3 0 FSL (SUTURE) ×2 IMPLANT
SUT PDS AB 1 TP1 96 (SUTURE) ×6 IMPLANT
SUT SILK 2 0 (SUTURE) ×3
SUT SILK 2 0 SH CR/8 (SUTURE) ×3 IMPLANT
SUT SILK 2 0 TIES 10X30 (SUTURE) ×5 IMPLANT
SUT SILK 2-0 18XBRD TIE 12 (SUTURE) IMPLANT
SUT SILK 3 0 (SUTURE) ×3
SUT SILK 3 0 SH CR/8 (SUTURE) ×1 IMPLANT
SUT SILK 3 0 TIES 10X30 (SUTURE) ×5 IMPLANT
SUT SILK 3-0 18XBRD TIE 12 (SUTURE) IMPLANT
SUT VIC AB 3-0 SH 18 (SUTURE) IMPLANT
SYR BULB IRRIGATION 50ML (SYRINGE) ×2 IMPLANT
TAPE STRIPS DRAPE STRL (GAUZE/BANDAGES/DRESSINGS) ×4 IMPLANT
TOWEL OR 17X26 10 PK STRL BLUE (TOWEL DISPOSABLE) ×3 IMPLANT
TRAY FOLEY W/METER SILVER 16FR (SET/KITS/TRAYS/PACK) ×2 IMPLANT
YANKAUER SUCT BULB TIP NO VENT (SUCTIONS) ×2 IMPLANT
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
## 2017-01-16 NOTE — Progress Notes (Signed)
RT late entry. RT assisted ED MD with intubations. RT transported patient to OR post intubation.
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
## 2017-01-16 NOTE — ED Provider Notes (Signed)
  Physical Exam  BP (!) 168/108   Pulse (!) 123   Resp (!) 33   SpO2 98%   Physical Exam  ED Course  CHEST TUBE INSERTION Date/Time: 01/16/2017 6:03 PM Performed by: Lockie MolaURATOLO, Seanna Sisler Authorized by: Manus RuddSUEI, MATTHEW   Consent:    Consent obtained:  Emergent situation Pre-procedure details:    Skin preparation:  ChloraPrep   Preparation: Patient was prepped and draped in the usual sterile fashion   Sedation:    Sedation type:  Anxiolysis and moderate (conscious) sedation Anesthesia (see MAR for exact dosages):    Anesthesia method:  Local infiltration   Local anesthetic:  Lidocaine 1% w/o epi Procedure details:    Placement location:  L lateral   Scalpel size:  11   Tube size (Fr):  28   Dissection instrument:  Finger and Kelly clamp   Ultrasound guidance: no     Tension pneumothorax: no     Tube connected to:  Suction   Drainage characteristics:  Bloody   Suture material:  0 silk   Dressing:  4x4 sterile gauze and Xeroform gauze Post-procedure details:    Post-insertion x-ray findings: tube in good position     Patient tolerance of procedure:  Tolerated well, no immediate complications     Virgina NorfolkCuratolo, Geralynn Capri, DO 01/16/17 1804    Virgina Norfolkuratolo, Azhar Knope, DO 01/16/17 1806    Virgina Norfolkuratolo, Zyairah Wacha, DO 01/16/17 1824    Abelino DerrickMackuen, Courteney Lyn, MD 01/17/17 2033
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
## 2017-01-16 NOTE — ED Notes (Signed)
Fast exam positive
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
## 2017-01-16 NOTE — ED Notes (Signed)
Lactic acid result given to Dr. Arlee Muslim. Mackuen
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
## 2017-01-16 NOTE — Anesthesia Postprocedure Evaluation (Signed)
Anesthesia Post Note  Patient: Craig Calderon P XXXRushing  Procedure(s) Performed: Procedure(s) (LRB): EXPLORATORY LAPAROTOMY PACKING LIVER WOUND, PACKING LEFT KIDNEY WOUND, SPLENECTOMY (N/A)  Patient location during evaluation: SICU Anesthesia Type: General Level of consciousness: sedated Pain management: pain level controlled Vital Signs Assessment: post-procedure vital signs reviewed and stable Respiratory status: patient remains intubated per anesthesia plan Cardiovascular status: stable Anesthetic complications: no       Last Vitals:  Vitals:   01/16/17 2100 01/16/17 2200  BP: 111/80 127/72  Pulse: 83 84  Resp: (!) 21 20  Temp:      Last Pain:  Vitals:   01/16/17 2000  TempSrc: Axillary                 Cecile HearingStephen Edward Turk

## 2017-01-16 NOTE — Transfer of Care (Signed)
Immediate Anesthesia Transfer of Care Note  Patient: [name] P XXXRushing  Procedure(s) Performed: Procedure(s): EXPLORATORY LAPAROTOMY PACKING LIVER WOUND, PACKING LEFT KIDNEY WOUND, SPLENECTOMY (N/A)  Patient Location: ICU  Anesthesia Type:General  Level of Consciousness: sedated, unresponsive and Patient remains intubated per anesthesia plan  Airway & Oxygen Therapy: Patient remains intubated per anesthesia plan and Patient placed on Ventilator (see vital sign flow sheet for setting)  Post-op Assessment: Report given to RN and Post -op Vital signs reviewed and stable  Post vital signs: Reviewed and stable  Last Vitals:  Vitals:   01/16/17 1757 01/16/17 1800  BP: (!) 167/119 (!) 168/108  Pulse: (!) 119 (!) 123  Resp: (!) 27 (!) 33    Last Pain: There were no vitals filed for this visit.       Complications: No apparent anesthesia complications

## 2017-01-16 NOTE — ED Notes (Signed)
TD given in right deltoid

## 2017-01-17 ENCOUNTER — Inpatient Hospital Stay (HOSPITAL_COMMUNITY): Payer: Medicaid Other

## 2017-01-17 ENCOUNTER — Encounter (HOSPITAL_COMMUNITY): Payer: Self-pay | Admitting: Surgery

## 2017-01-17 HISTORY — PX: SPLENECTOMY: SUR1306

## 2017-01-17 LAB — CBC
HCT: 36.1 % — ABNORMAL LOW (ref 39.0–52.0)
HEMOGLOBIN: 12.3 g/dL — AB (ref 13.0–17.0)
MCH: 30 pg (ref 26.0–34.0)
MCHC: 34.1 g/dL (ref 30.0–36.0)
MCV: 88 fL (ref 78.0–100.0)
PLATELETS: 209 10*3/uL (ref 150–400)
RBC: 4.1 MIL/uL — ABNORMAL LOW (ref 4.22–5.81)
RDW: 16.3 % — ABNORMAL HIGH (ref 11.5–15.5)
WBC: 10.2 10*3/uL (ref 4.0–10.5)

## 2017-01-17 LAB — PREPARE CRYOPRECIPITATE: UNIT DIVISION: 0

## 2017-01-17 LAB — BPAM FFP
BLOOD PRODUCT EXPIRATION DATE: 201805172359
BLOOD PRODUCT EXPIRATION DATE: 201805172359
BLOOD PRODUCT EXPIRATION DATE: 201805172359
Blood Product Expiration Date: 201805172359
Blood Product Expiration Date: 201805172359
Blood Product Expiration Date: 201805172359
Blood Product Expiration Date: 201805172359
Blood Product Expiration Date: 201805172359
Blood Product Expiration Date: 201805172359
Blood Product Expiration Date: 201805312359
ISSUE DATE / TIME: 201805151816
ISSUE DATE / TIME: 201805151816
ISSUE DATE / TIME: 201805151843
ISSUE DATE / TIME: 201805151843
ISSUE DATE / TIME: 201805161110
ISSUE DATE / TIME: 201805161445
ISSUE DATE / TIME: 201805151816
ISSUE DATE / TIME: 201805151816
ISSUE DATE / TIME: 201805161445
ISSUE DATE / TIME: 201805161445
UNIT TYPE AND RH: 6200
UNIT TYPE AND RH: 6200
UNIT TYPE AND RH: 6200
UNIT TYPE AND RH: 6200
UNIT TYPE AND RH: 6200
Unit Type and Rh: 6200
Unit Type and Rh: 6200
Unit Type and Rh: 6200
Unit Type and Rh: 6200
Unit Type and Rh: 6200

## 2017-01-17 LAB — PREPARE FRESH FROZEN PLASMA
UNIT DIVISION: 0
UNIT DIVISION: 0
UNIT DIVISION: 0
UNIT DIVISION: 0
Unit division: 0
Unit division: 0
Unit division: 0
Unit division: 0
Unit division: 0
Unit division: 0

## 2017-01-17 LAB — COMPREHENSIVE METABOLIC PANEL
ALBUMIN: 2.9 g/dL — AB (ref 3.5–5.0)
ALT: 130 U/L — AB (ref 17–63)
AST: 259 U/L — AB (ref 15–41)
Alkaline Phosphatase: 47 U/L (ref 38–126)
Anion gap: 10 (ref 5–15)
BUN: 13 mg/dL (ref 6–20)
CHLORIDE: 113 mmol/L — AB (ref 101–111)
CO2: 18 mmol/L — ABNORMAL LOW (ref 22–32)
CREATININE: 1.69 mg/dL — AB (ref 0.61–1.24)
Calcium: 7.5 mg/dL — ABNORMAL LOW (ref 8.9–10.3)
GFR calc Af Amer: >60 mL/min (ref >60)
GFR, EST NON AFRICAN AMERICAN: 55 mL/min — AB (ref >60)
GLUCOSE: 124 mg/dL — AB (ref 65–99)
Potassium: 4.2 mmol/L (ref 3.5–5.1)
Sodium: 141 mmol/L (ref 135–145)
Total Bilirubin: 1.1 mg/dL (ref 0.3–1.2)
Total Protein: 5 g/dL — ABNORMAL LOW (ref 6.5–8.1)

## 2017-01-17 LAB — BPAM CRYOPRECIPITATE
Blood Product Expiration Date: 201805160042
Unit Type and Rh: 6200

## 2017-01-17 LAB — POCT I-STAT 3, ART BLOOD GAS (G3+)
ACID-BASE DEFICIT: 1 mmol/L (ref 0.0–2.0)
BICARBONATE: 24.3 mmol/L (ref 20.0–28.0)
O2 SAT: 99 %
PCO2 ART: 44.7 mmHg (ref 32.0–48.0)
PO2 ART: 130 mmHg — AB (ref 83.0–108.0)
Patient temperature: 99.9
TCO2: 26 mmol/L (ref 0–100)
pH, Arterial: 7.347 — ABNORMAL LOW (ref 7.350–7.450)

## 2017-01-17 LAB — PROTIME-INR
INR: 1.13
PROTHROMBIN TIME: 14.5 s (ref 11.4–15.2)

## 2017-01-17 LAB — HIV ANTIBODY (ROUTINE TESTING W REFLEX): HIV SCREEN 4TH GENERATION: NONREACTIVE

## 2017-01-17 LAB — BLOOD PRODUCT ORDER (VERBAL) VERIFICATION

## 2017-01-17 MED ORDER — FENTANYL CITRATE (PF) 100 MCG/2ML IJ SOLN
INTRAMUSCULAR | Status: AC
  Administered 2017-01-17: 100 ug
  Filled 2017-01-17: qty 2

## 2017-01-17 MED ORDER — FENTANYL CITRATE (PF) 100 MCG/2ML IJ SOLN
50.0000 ug | INTRAMUSCULAR | Status: DC | PRN
  Administered 2017-01-17 – 2017-01-18 (×9): 100 ug via INTRAVENOUS
  Filled 2017-01-17 (×9): qty 2

## 2017-01-17 MED ORDER — SODIUM CHLORIDE 0.9 % IV SOLN
500.0000 mg | Freq: Two times a day (BID) | INTRAVENOUS | Status: DC
  Administered 2017-01-17 – 2017-01-21 (×8): 500 mg via INTRAVENOUS
  Filled 2017-01-17 (×9): qty 5
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
## 2017-01-17 NOTE — Procedures (Signed)
Extubation Procedure Note  Patient Details:   Name: [name] P XXXRushing DOB: 11/22/1990 MRN: [id number]   Airway Documentation:     Evaluation  O2 sats: stable throughout Complications: No apparent complications Patient did tolerate procedure well. Bilateral Breath Sounds: Clear, Diminished   Yes   Pt extubated to 4L , no stridor noted.  RN at bedside.  Christophe LouisSteven D Halla Chopp 01/17/2017, 3:35 PM
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
## 2017-01-18 ENCOUNTER — Encounter (HOSPITAL_COMMUNITY): Payer: Self-pay | Admitting: *Deleted

## 2017-01-18 ENCOUNTER — Inpatient Hospital Stay (HOSPITAL_COMMUNITY): Payer: Medicaid Other

## 2017-01-18 LAB — POCT I-STAT 7, (LYTES, BLD GAS, ICA,H+H)
ACID-BASE DEFICIT: 7 mmol/L — AB (ref 0.0–2.0)
Acid-base deficit: 11 mmol/L — ABNORMAL HIGH (ref 0.0–2.0)
Bicarbonate: 18.9 mmol/L — ABNORMAL LOW (ref 20.0–28.0)
Bicarbonate: 19.3 mmol/L — ABNORMAL LOW (ref 20.0–28.0)
Calcium, Ion: 1.04 mmol/L — ABNORMAL LOW (ref 1.15–1.40)
Calcium, Ion: 0.93 mmol/L — ABNORMAL LOW (ref 1.15–1.40)
HCT: 39 % (ref 39.0–52.0)
HEMATOCRIT: 33 % — AB (ref 39.0–52.0)
HEMOGLOBIN: 13.3 g/dL (ref 13.0–17.0)
HEMOGLOBIN: 11.2 g/dL — AB (ref 13.0–17.0)
O2 SAT: 100 %
O2 Saturation: 99 %
PCO2 ART: 55.1 mmHg — AB (ref 32.0–48.0)
PH ART: 7.133 — AB (ref 7.350–7.450)
PH ART: 7.294 — AB (ref 7.350–7.450)
PO2 ART: 160 mmHg — AB (ref 83.0–108.0)
POTASSIUM: 5.4 mmol/L — AB (ref 3.5–5.1)
POTASSIUM: 5.7 mmol/L — AB (ref 3.5–5.1)
Patient temperature: 35.5
SODIUM: 143 mmol/L (ref 135–145)
Sodium: 146 mmol/L — ABNORMAL HIGH (ref 135–145)
TCO2: 21 mmol/L (ref 0–100)
TCO2: 21 mmol/L (ref 0–100)
pCO2 arterial: 39 mmHg (ref 32.0–48.0)
pO2, Arterial: 299 mmHg — ABNORMAL HIGH (ref 83.0–108.0)

## 2017-01-18 LAB — CBC WITH DIFFERENTIAL/PLATELET
BASOS ABS: 0 10*3/uL (ref 0.0–0.1)
BASOS PCT: 0 %
EOS PCT: 0 %
Eosinophils Absolute: 0 10*3/uL (ref 0.0–0.7)
HEMATOCRIT: 34.2 % — AB (ref 39.0–52.0)
Hemoglobin: 11 g/dL — ABNORMAL LOW (ref 13.0–17.0)
Lymphocytes Relative: 7 %
Lymphs Abs: 1.1 10*3/uL (ref 0.7–4.0)
MCH: 29.3 pg (ref 26.0–34.0)
MCHC: 32.2 g/dL (ref 30.0–36.0)
MCV: 91 fL (ref 78.0–100.0)
MONO ABS: 1.9 10*3/uL — AB (ref 0.1–1.0)
MONOS PCT: 11 %
Neutro Abs: 14.3 10*3/uL — ABNORMAL HIGH (ref 1.7–7.7)
Neutrophils Relative %: 82 %
PLATELETS: 190 10*3/uL (ref 150–400)
RBC: 3.76 MIL/uL — ABNORMAL LOW (ref 4.22–5.81)
RDW: 16.1 % — AB (ref 11.5–15.5)
WBC: 17.4 10*3/uL — ABNORMAL HIGH (ref 4.0–10.5)

## 2017-01-18 LAB — BASIC METABOLIC PANEL
ANION GAP: 7 (ref 5–15)
BUN: 11 mg/dL (ref 6–20)
CALCIUM: 7.9 mg/dL — AB (ref 8.9–10.3)
CO2: 24 mmol/L (ref 22–32)
CREATININE: 1.61 mg/dL — AB (ref 0.61–1.24)
Chloride: 110 mmol/L (ref 101–111)
GFR, EST NON AFRICAN AMERICAN: 58 mL/min — AB (ref >60)
GLUCOSE: 128 mg/dL — AB (ref 65–99)
Potassium: 4.3 mmol/L (ref 3.5–5.1)
Sodium: 141 mmol/L (ref 135–145)

## 2017-01-18 LAB — GLUCOSE, CAPILLARY: Glucose-Capillary: 125 mg/dL — ABNORMAL HIGH (ref 65–99)

## 2017-01-18 MED ORDER — OXYCODONE HCL 5 MG PO TABS
5.0000 mg | ORAL_TABLET | ORAL | Status: DC | PRN
  Administered 2017-01-18 (×3): 10 mg via ORAL
  Administered 2017-01-19 (×2): 15 mg via ORAL
  Administered 2017-01-19 (×2): 10 mg via ORAL
  Administered 2017-01-20 – 2017-01-28 (×22): 15 mg via ORAL
  Filled 2017-01-18 (×4): qty 3
  Filled 2017-01-18: qty 2
  Filled 2017-01-18 (×5): qty 3
  Filled 2017-01-18: qty 2
  Filled 2017-01-18 (×3): qty 3
  Filled 2017-01-18: qty 2
  Filled 2017-01-18 (×5): qty 3
  Filled 2017-01-18: qty 2
  Filled 2017-01-18 (×10): qty 3
  Filled 2017-01-18: qty 2
  Filled 2017-01-18: qty 3

## 2017-01-18 MED ORDER — PREGABALIN 75 MG PO CAPS
75.0000 mg | ORAL_CAPSULE | Freq: Two times a day (BID) | ORAL | Status: DC
  Administered 2017-01-18 – 2017-01-29 (×22): 75 mg via ORAL
  Filled 2017-01-18 (×22): qty 1
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
## 2017-01-18 NOTE — Progress Notes (Signed)
Tech offered Pt a bath. Pt stated to tech that he would like to wait until a little later on today.

## 2017-01-18 NOTE — Progress Notes (Signed)
Orthopedic Tech Progress Note Patient Details:  Craig Calderon 06/24/1991 [id number]  Patient ID: Craig Calderon, male   DOB: 10/19/1990, 26 y.o.   MRN: [id number]   Craig Calderon, Craig Calderon 01/18/2017, 10:36 AM Called in bio-tech brace order; spoke with Craig Calderon

## 2017-01-18 NOTE — Progress Notes (Signed)
Pt seen and examined. No issues overnight. No current concerns  EXAM: Temp:  [98.4 F (36.9 C)-99.6 F (37.6 C)] 98.4 F (36.9 C) (05/17 1219) Pulse Rate:  [110-131] 119 (05/17 1300) Resp:  [13-23] 15 (05/17 1300) BP: (140-157)/(76-101) 143/82 (05/17 1300) SpO2:  [91 %-100 %] 94 % (05/17 1300) Arterial Line BP: (134-158)/(95-111) 158/111 (05/16 1600) Intake/Output      05/16 0701 - 05/17 0700 05/17 0701 - 05/18 0700   I.V. (mL/kg) 2508.7 (23.6) 600 (5.7)   Blood     IV Piggyback 250 105   Total Intake(mL/kg) 2758.7 (26) 705 (6.6)   Urine (mL/kg/hr) 1465 (0.6)    Emesis/NG output 150 (0.1)    Drains 45 (0)    Blood     Chest Tube 90 (0)    Total Output 1750     Net +1008.7 +705         Awake and alert Follows commands throughout Moves BUE well No movement BLE  Stable Continue current care
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
## 2017-01-19 ENCOUNTER — Inpatient Hospital Stay (HOSPITAL_COMMUNITY): Payer: Medicaid Other

## 2017-01-19 LAB — BASIC METABOLIC PANEL
Anion gap: 8 (ref 5–15)
BUN: 9 mg/dL (ref 6–20)
CALCIUM: 8.2 mg/dL — AB (ref 8.9–10.3)
CO2: 23 mmol/L (ref 22–32)
Chloride: 106 mmol/L (ref 101–111)
Creatinine, Ser: 1.31 mg/dL — ABNORMAL HIGH (ref 0.61–1.24)
GFR calc Af Amer: >60 mL/min (ref >60)
GLUCOSE: 101 mg/dL — AB (ref 65–99)
Potassium: 4 mmol/L (ref 3.5–5.1)
Sodium: 137 mmol/L (ref 135–145)

## 2017-01-19 LAB — CBC
HEMATOCRIT: 28.7 % — AB (ref 39.0–52.0)
Hemoglobin: 9.2 g/dL — ABNORMAL LOW (ref 13.0–17.0)
MCH: 29.4 pg (ref 26.0–34.0)
MCHC: 32.1 g/dL (ref 30.0–36.0)
MCV: 91.7 fL (ref 78.0–100.0)
PLATELETS: 194 10*3/uL (ref 150–400)
RBC: 3.13 MIL/uL — ABNORMAL LOW (ref 4.22–5.81)
RDW: 15.6 % — AB (ref 11.5–15.5)
WBC: 17.5 10*3/uL — ABNORMAL HIGH (ref 4.0–10.5)

## 2017-01-19 MED ORDER — HYDROMORPHONE HCL 1 MG/ML IJ SOLN
0.5000 mg | INTRAMUSCULAR | Status: DC | PRN
  Administered 2017-01-19: 1 mg via INTRAVENOUS
  Administered 2017-01-19 (×2): 0.5 mg via INTRAVENOUS
  Administered 2017-01-20: 1 mg via INTRAVENOUS
  Administered 2017-01-20: 2 mg via INTRAVENOUS
  Administered 2017-01-20: 1 mg via INTRAVENOUS
  Administered 2017-01-21 – 2017-01-22 (×4): 2 mg via INTRAVENOUS
  Administered 2017-01-22: 1 mg via INTRAVENOUS
  Administered 2017-01-22 – 2017-01-23 (×7): 2 mg via INTRAVENOUS
  Administered 2017-01-24: 1 mg via INTRAVENOUS
  Administered 2017-01-24 (×2): 2 mg via INTRAVENOUS
  Administered 2017-01-24: 1 mg via INTRAVENOUS
  Administered 2017-01-24: 2 mg via INTRAVENOUS
  Administered 2017-01-25 – 2017-01-27 (×12): 1 mg via INTRAVENOUS
  Administered 2017-01-28: 2 mg via INTRAVENOUS
  Administered 2017-01-28 – 2017-01-29 (×8): 1 mg via INTRAVENOUS
  Filled 2017-01-19: qty 1
  Filled 2017-01-19 (×3): qty 2
  Filled 2017-01-19: qty 1
  Filled 2017-01-19: qty 2
  Filled 2017-01-19 (×6): qty 1
  Filled 2017-01-19: qty 2
  Filled 2017-01-19 (×2): qty 1
  Filled 2017-01-19: qty 2
  Filled 2017-01-19: qty 1
  Filled 2017-01-19: qty 2
  Filled 2017-01-19: qty 1
  Filled 2017-01-19: qty 2
  Filled 2017-01-19: qty 1
  Filled 2017-01-19 (×2): qty 2
  Filled 2017-01-19 (×2): qty 1
  Filled 2017-01-19: qty 2
  Filled 2017-01-19 (×2): qty 1
  Filled 2017-01-19: qty 2
  Filled 2017-01-19: qty 1
  Filled 2017-01-19: qty 2
  Filled 2017-01-19: qty 1
  Filled 2017-01-19: qty 2
  Filled 2017-01-19 (×6): qty 1
  Filled 2017-01-19: qty 2
  Filled 2017-01-19 (×2): qty 1
  Filled 2017-01-19 (×2): qty 2

## 2017-01-19 MED ORDER — BOOST / RESOURCE BREEZE PO LIQD
1.0000 | Freq: Three times a day (TID) | ORAL | Status: DC
  Administered 2017-01-19 – 2017-01-23 (×10): 1 via ORAL
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
## 2017-01-19 NOTE — Progress Notes (Signed)
Inpatient Rehabilitation  Per therapy request, patient was screened by Guila Owensby for appropriateness for an Inpatient Acute Rehab consult.  At this time we are recommending an Inpatient Rehab consult.  Text paged MD to request order; please order if you are agreeable.    Taevion Sikora, M.A., CCC/SLP Admission Coordinator  [facility]  Cell [phone]  

## 2017-01-19 NOTE — Progress Notes (Addendum)
Admitted from 4N. Patient is alert and oriented, with flat affect. Oriented to room, call light and staff. VSS, tubes intact. Not in any distress. Will monitor.

## 2017-01-19 NOTE — Progress Notes (Signed)
Pt seen and examined.  No issues overnight. Complains of abd discomfort, but otherwise no complaints  EXAM: Temp:  [98.9 F (37.2 C)-100.2 F (37.9 C)] 99.5 F (37.5 C) (05/18 1200) Pulse Rate:  [107-127] 115 (05/18 1400) Resp:  [13-26] 25 (05/18 1400) BP: (153-177)/(79-105) 174/92 (05/18 1400) SpO2:  [91 %-100 %] 98 % (05/18 1400) Intake/Output      05/17 0701 - 05/18 0700 05/18 0701 - 05/19 0700   P.O.  420   I.V. (mL/kg) 2400 (22.6) 436.7 (4.1)   IV Piggyback 105 105   Total Intake(mL/kg) 2505 (23.6) 961.7 (9.1)   Urine (mL/kg/hr) 1775 (0.7) 1200 (1.5)   Emesis/NG output     Drains 70 (0)    Chest Tube 278 (0.1)    Total Output 2123 1200   Net +382 -238.3          Awake and alert Follows commands throughout CN grossly intact Moves BUE well No movement BLE  Stable Continue current care

## 2017-01-20 ENCOUNTER — Inpatient Hospital Stay (HOSPITAL_COMMUNITY): Payer: Medicaid Other

## 2017-01-20 LAB — TYPE AND SCREEN
ABO/RH(D): A POS
Antibody Screen: NEGATIVE
UNIT DIVISION: 0
UNIT DIVISION: 0
UNIT DIVISION: 0
UNIT DIVISION: 0
UNIT DIVISION: 0
UNIT DIVISION: 0
UNIT DIVISION: 0
Unit division: 0
Unit division: 0
Unit division: 0
Unit division: 0
Unit division: 0
Unit division: 0
Unit division: 0
Unit division: 0
Unit division: 0

## 2017-01-20 LAB — BPAM RBC
BLOOD PRODUCT EXPIRATION DATE: 201805222359
BLOOD PRODUCT EXPIRATION DATE: 201806012359
BLOOD PRODUCT EXPIRATION DATE: 201806012359
BLOOD PRODUCT EXPIRATION DATE: 201806012359
BLOOD PRODUCT EXPIRATION DATE: 201806012359
BLOOD PRODUCT EXPIRATION DATE: 201806022359
BLOOD PRODUCT EXPIRATION DATE: 201806022359
Blood Product Expiration Date: 201805222359
Blood Product Expiration Date: 201805222359
Blood Product Expiration Date: 201805282359
Blood Product Expiration Date: 201805282359
Blood Product Expiration Date: 201806012359
Blood Product Expiration Date: 201806022359
Blood Product Expiration Date: 201806022359
Blood Product Expiration Date: 201806022359
Blood Product Expiration Date: 201806022359
ISSUE DATE / TIME: 201805151714
ISSUE DATE / TIME: 201805151714
ISSUE DATE / TIME: 201805151815
ISSUE DATE / TIME: 201805151815
ISSUE DATE / TIME: 201805151840
ISSUE DATE / TIME: 201805151840
ISSUE DATE / TIME: 201805152246
ISSUE DATE / TIME: 201805161440
ISSUE DATE / TIME: 201805180926
ISSUE DATE / TIME: 201805181340
ISSUE DATE / TIME: 201805182102
ISSUE DATE / TIME: 201805182118
ISSUE DATE / TIME: 201805182209
ISSUE DATE / TIME: 201805182246
ISSUE DATE / TIME: 201805182352
ISSUE DATE / TIME: 201805182352
UNIT TYPE AND RH: 6200
UNIT TYPE AND RH: 6200
UNIT TYPE AND RH: 6200
UNIT TYPE AND RH: 6200
UNIT TYPE AND RH: 6200
UNIT TYPE AND RH: 6200
UNIT TYPE AND RH: 9500
Unit Type and Rh: 6200
Unit Type and Rh: 6200
Unit Type and Rh: 6200
Unit Type and Rh: 6200
Unit Type and Rh: 6200
Unit Type and Rh: 6200
Unit Type and Rh: 9500
Unit Type and Rh: 9500
Unit Type and Rh: 9500
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
## 2017-01-21 ENCOUNTER — Inpatient Hospital Stay (HOSPITAL_COMMUNITY): Payer: Medicaid Other

## 2017-01-21 LAB — CBC
HEMATOCRIT: 28.4 % — AB (ref 39.0–52.0)
HEMOGLOBIN: 9 g/dL — AB (ref 13.0–17.0)
MCH: 28.7 pg (ref 26.0–34.0)
MCHC: 31.7 g/dL (ref 30.0–36.0)
MCV: 90.4 fL (ref 78.0–100.0)
Platelets: 337 10*3/uL (ref 150–400)
RBC: 3.14 MIL/uL — ABNORMAL LOW (ref 4.22–5.81)
RDW: 14.8 % (ref 11.5–15.5)
WBC: 12 10*3/uL — ABNORMAL HIGH (ref 4.0–10.5)

## 2017-01-21 LAB — BASIC METABOLIC PANEL
ANION GAP: 8 (ref 5–15)
BUN: 14 mg/dL (ref 6–20)
CHLORIDE: 102 mmol/L (ref 101–111)
CO2: 25 mmol/L (ref 22–32)
Calcium: 8.4 mg/dL — ABNORMAL LOW (ref 8.9–10.3)
Creatinine, Ser: 1.12 mg/dL (ref 0.61–1.24)
GFR calc Af Amer: >60 mL/min (ref >60)
GFR calc non Af Amer: >60 mL/min (ref >60)
Glucose, Bld: 106 mg/dL — ABNORMAL HIGH (ref 65–99)
POTASSIUM: 3.7 mmol/L (ref 3.5–5.1)
SODIUM: 135 mmol/L (ref 135–145)

## 2017-01-21 MED ORDER — LEVETIRACETAM 500 MG PO TABS
500.0000 mg | ORAL_TABLET | Freq: Two times a day (BID) | ORAL | Status: DC
  Administered 2017-01-21 – 2017-01-29 (×10): 500 mg via ORAL
  Filled 2017-01-21 (×15): qty 1

## 2017-01-21 MED ORDER — MENINGOCOCCAL A C Y&W-135 OLIG IM SOLR
0.5000 mL | Freq: Once | INTRAMUSCULAR | Status: AC
  Administered 2017-01-23: 0.5 mL via INTRAMUSCULAR
  Filled 2017-01-21: qty 0.5

## 2017-01-21 MED ORDER — ENOXAPARIN SODIUM 40 MG/0.4ML ~~LOC~~ SOLN
40.0000 mg | SUBCUTANEOUS | Status: DC
  Administered 2017-01-21 – 2017-01-29 (×8): 40 mg via SUBCUTANEOUS
  Filled 2017-01-21 (×8): qty 0.4

## 2017-01-21 MED ORDER — HAEMOPHILUS B POLYSAC CONJ VAC IM SOLR
0.5000 mL | Freq: Once | INTRAMUSCULAR | Status: AC
  Administered 2017-01-23: 0.5 mL via INTRAMUSCULAR
  Filled 2017-01-21: qty 0.5

## 2017-01-21 MED ORDER — PNEUMOCOCCAL VAC POLYVALENT 25 MCG/0.5ML IJ INJ
0.5000 mL | INJECTION | INTRAMUSCULAR | Status: AC
  Administered 2017-01-23: 0.5 mL via INTRAMUSCULAR

## 2017-01-21 NOTE — Progress Notes (Signed)
Encouraged pt. To do IS. Pt states he will do it after he eats.

## 2017-01-21 NOTE — Progress Notes (Signed)
Chest tube marked at 720.
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
## 2017-01-22 ENCOUNTER — Inpatient Hospital Stay (HOSPITAL_COMMUNITY): Payer: Medicaid Other

## 2017-01-22 DIAGNOSIS — E8809 Other disorders of plasma-protein metabolism, not elsewhere classified: Secondary | ICD-10-CM

## 2017-01-22 DIAGNOSIS — E46 Unspecified protein-calorie malnutrition: Secondary | ICD-10-CM

## 2017-01-22 DIAGNOSIS — Z9889 Other specified postprocedural states: Secondary | ICD-10-CM

## 2017-01-22 DIAGNOSIS — N179 Acute kidney failure, unspecified: Secondary | ICD-10-CM

## 2017-01-22 DIAGNOSIS — J942 Hemothorax: Secondary | ICD-10-CM

## 2017-01-22 DIAGNOSIS — D62 Acute posthemorrhagic anemia: Secondary | ICD-10-CM

## 2017-01-22 DIAGNOSIS — S299XXA Unspecified injury of thorax, initial encounter: Secondary | ICD-10-CM

## 2017-01-22 DIAGNOSIS — M792 Neuralgia and neuritis, unspecified: Secondary | ICD-10-CM

## 2017-01-22 DIAGNOSIS — W3400XA Accidental discharge from unspecified firearms or gun, initial encounter: Secondary | ICD-10-CM

## 2017-01-22 DIAGNOSIS — G8389 Other specified paralytic syndromes: Secondary | ICD-10-CM

## 2017-01-22 DIAGNOSIS — S24104D Unspecified injury at T11-T12 level of thoracic spinal cord, subsequent encounter: Secondary | ICD-10-CM

## 2017-01-22 DIAGNOSIS — S24104A Unspecified injury at T11-T12 level of thoracic spinal cord, initial encounter: Secondary | ICD-10-CM

## 2017-01-22 DIAGNOSIS — S299XXS Unspecified injury of thorax, sequela: Secondary | ICD-10-CM

## 2017-01-22 DIAGNOSIS — D72829 Elevated white blood cell count, unspecified: Secondary | ICD-10-CM

## 2017-01-22 DIAGNOSIS — G8100 Flaccid hemiplegia affecting unspecified side: Secondary | ICD-10-CM

## 2017-01-22 DIAGNOSIS — G8918 Other acute postprocedural pain: Secondary | ICD-10-CM

## 2017-01-22 LAB — COMPREHENSIVE METABOLIC PANEL
ALBUMIN: 2.4 g/dL — AB (ref 3.5–5.0)
ALK PHOS: 64 U/L (ref 38–126)
ALT: 54 U/L (ref 17–63)
ANION GAP: 8 (ref 5–15)
AST: 50 U/L — ABNORMAL HIGH (ref 15–41)
BILIRUBIN TOTAL: 1 mg/dL (ref 0.3–1.2)
BUN: 17 mg/dL (ref 6–20)
CALCIUM: 8.6 mg/dL — AB (ref 8.9–10.3)
CO2: 26 mmol/L (ref 22–32)
CREATININE: 1.15 mg/dL (ref 0.61–1.24)
Chloride: 102 mmol/L (ref 101–111)
GFR calc Af Amer: >60 mL/min (ref >60)
GFR calc non Af Amer: >60 mL/min (ref >60)
GLUCOSE: 113 mg/dL — AB (ref 65–99)
Potassium: 4.1 mmol/L (ref 3.5–5.1)
Sodium: 136 mmol/L (ref 135–145)
Total Protein: 6.2 g/dL — ABNORMAL LOW (ref 6.5–8.1)

## 2017-01-22 LAB — CBC
HCT: 30.9 % — ABNORMAL LOW (ref 39.0–52.0)
Hemoglobin: 9.9 g/dL — ABNORMAL LOW (ref 13.0–17.0)
MCH: 28.9 pg (ref 26.0–34.0)
MCHC: 32 g/dL (ref 30.0–36.0)
MCV: 90.4 fL (ref 78.0–100.0)
PLATELETS: 460 10*3/uL — AB (ref 150–400)
RBC: 3.42 MIL/uL — ABNORMAL LOW (ref 4.22–5.81)
RDW: 14.5 % (ref 11.5–15.5)
WBC: 14 10*3/uL — ABNORMAL HIGH (ref 4.0–10.5)

## 2017-01-22 LAB — CREATININE, FLUID (PLEURAL, PERITONEAL, JP DRAINAGE): CREAT FL: 38.3 mg/dL

## 2017-01-22 MED ORDER — LORAZEPAM 1 MG PO TABS
1.0000 mg | ORAL_TABLET | Freq: Four times a day (QID) | ORAL | Status: DC | PRN
  Administered 2017-01-22 – 2017-01-28 (×3): 1 mg via ORAL
  Filled 2017-01-22 (×5): qty 1

## 2017-01-22 MED ORDER — BISACODYL 10 MG RE SUPP
10.0000 mg | Freq: Every day | RECTAL | Status: DC
  Administered 2017-01-22 – 2017-01-28 (×5): 10 mg via RECTAL
  Filled 2017-01-22 (×7): qty 1

## 2017-01-22 MED ORDER — DOCUSATE SODIUM 50 MG/5ML PO LIQD
100.0000 mg | Freq: Two times a day (BID) | ORAL | Status: DC | PRN
  Filled 2017-01-22: qty 10
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
## 2017-01-23 ENCOUNTER — Encounter (HOSPITAL_COMMUNITY): Payer: Self-pay | Admitting: Radiology

## 2017-01-23 ENCOUNTER — Inpatient Hospital Stay (HOSPITAL_COMMUNITY): Payer: Medicaid Other

## 2017-01-23 LAB — BASIC METABOLIC PANEL
ANION GAP: 9 (ref 5–15)
BUN: 19 mg/dL (ref 6–20)
CALCIUM: 8.8 mg/dL — AB (ref 8.9–10.3)
CO2: 24 mmol/L (ref 22–32)
Chloride: 100 mmol/L — ABNORMAL LOW (ref 101–111)
Creatinine, Ser: 1.12 mg/dL (ref 0.61–1.24)
Glucose, Bld: 101 mg/dL — ABNORMAL HIGH (ref 65–99)
POTASSIUM: 4.4 mmol/L (ref 3.5–5.1)
Sodium: 133 mmol/L — ABNORMAL LOW (ref 135–145)

## 2017-01-23 LAB — CBC
HCT: 30.4 % — ABNORMAL LOW (ref 39.0–52.0)
Hemoglobin: 10 g/dL — ABNORMAL LOW (ref 13.0–17.0)
MCH: 29.4 pg (ref 26.0–34.0)
MCHC: 32.9 g/dL (ref 30.0–36.0)
MCV: 89.4 fL (ref 78.0–100.0)
PLATELETS: 565 10*3/uL — AB (ref 150–400)
RBC: 3.4 MIL/uL — AB (ref 4.22–5.81)
RDW: 14.1 % (ref 11.5–15.5)
WBC: 15.6 10*3/uL — AB (ref 4.0–10.5)

## 2017-01-23 MED ORDER — ENSURE ENLIVE PO LIQD
237.0000 mL | Freq: Two times a day (BID) | ORAL | Status: DC
  Administered 2017-01-23 – 2017-01-29 (×10): 237 mL via ORAL

## 2017-01-23 MED ORDER — BOOST / RESOURCE BREEZE PO LIQD
1.0000 | Freq: Every day | ORAL | Status: DC
  Administered 2017-01-24 – 2017-01-27 (×2): 1 via ORAL

## 2017-01-23 MED ORDER — IOPAMIDOL (ISOVUE-300) INJECTION 61%
INTRAVENOUS | Status: AC
  Administered 2017-01-23: 100 mL
  Filled 2017-01-23: qty 100
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
## 2017-01-23 NOTE — Progress Notes (Signed)
I met with pt at bedside with his Mom, Dad, and Grandmother. We discussed a poss bile inpt rehab admit pending bed available when pt medically ready. Mom Sport and exercise psychologist to meet with them today for disability and medicaid. I will follow. [phone]
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
## 2017-01-24 ENCOUNTER — Other Ambulatory Visit: Payer: Self-pay | Admitting: Urology

## 2017-01-24 ENCOUNTER — Inpatient Hospital Stay (HOSPITAL_COMMUNITY): Payer: Medicaid Other

## 2017-01-24 LAB — BASIC METABOLIC PANEL
ANION GAP: 10 (ref 5–15)
BUN: 17 mg/dL (ref 6–20)
CHLORIDE: 103 mmol/L (ref 101–111)
CO2: 21 mmol/L — AB (ref 22–32)
CREATININE: 1.08 mg/dL (ref 0.61–1.24)
Calcium: 9.1 mg/dL (ref 8.9–10.3)
GFR calc non Af Amer: >60 mL/min (ref >60)
Glucose, Bld: 99 mg/dL (ref 65–99)
POTASSIUM: 4.5 mmol/L (ref 3.5–5.1)
Sodium: 134 mmol/L — ABNORMAL LOW (ref 135–145)

## 2017-01-24 LAB — CBC
HEMATOCRIT: 34.1 % — AB (ref 39.0–52.0)
HEMOGLOBIN: 11.4 g/dL — AB (ref 13.0–17.0)
MCH: 29.5 pg (ref 26.0–34.0)
MCHC: 33.4 g/dL (ref 30.0–36.0)
MCV: 88.1 fL (ref 78.0–100.0)
Platelets: 562 10*3/uL — ABNORMAL HIGH (ref 150–400)
RBC: 3.87 MIL/uL — ABNORMAL LOW (ref 4.22–5.81)
RDW: 14.1 % (ref 11.5–15.5)
WBC: 16.5 10*3/uL — ABNORMAL HIGH (ref 4.0–10.5)
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
## 2017-01-25 ENCOUNTER — Inpatient Hospital Stay (HOSPITAL_COMMUNITY): Payer: Medicaid Other

## 2017-01-25 ENCOUNTER — Encounter (HOSPITAL_COMMUNITY): Admission: RE | Payer: Self-pay | Source: Ambulatory Visit

## 2017-01-25 ENCOUNTER — Inpatient Hospital Stay (HOSPITAL_COMMUNITY): Payer: Medicaid Other | Admitting: Anesthesiology

## 2017-01-25 ENCOUNTER — Encounter (HOSPITAL_COMMUNITY)
Admission: EM | Disposition: A | Discharge status: Rehabilitation Facility | Payer: Self-pay | Source: Home / Self Care | Attending: Urology

## 2017-01-25 ENCOUNTER — Inpatient Hospital Stay (HOSPITAL_COMMUNITY): Admission: RE | Admit: 2017-01-25 | Payer: BC Managed Care – PPO | Source: Ambulatory Visit | Admitting: Urology

## 2017-01-25 ENCOUNTER — Encounter (HOSPITAL_COMMUNITY): Payer: Self-pay | Admitting: Anesthesiology

## 2017-01-25 HISTORY — PX: CYSTOSCOPY W/ URETERAL STENT PLACEMENT: SHX1429

## 2017-01-25 LAB — CBC
HCT: 32.9 % — ABNORMAL LOW (ref 39.0–52.0)
Hemoglobin: 10.7 g/dL — ABNORMAL LOW (ref 13.0–17.0)
MCH: 28.8 pg (ref 26.0–34.0)
MCHC: 32.5 g/dL (ref 30.0–36.0)
MCV: 88.7 fL (ref 78.0–100.0)
PLATELETS: 819 10*3/uL — AB (ref 150–400)
RBC: 3.71 MIL/uL — AB (ref 4.22–5.81)
RDW: 14 % (ref 11.5–15.5)
WBC: 14.8 10*3/uL — AB (ref 4.0–10.5)

## 2017-01-25 LAB — BASIC METABOLIC PANEL
Anion gap: 8 (ref 5–15)
BUN: 18 mg/dL (ref 6–20)
CHLORIDE: 102 mmol/L (ref 101–111)
CO2: 25 mmol/L (ref 22–32)
CREATININE: 1.14 mg/dL (ref 0.61–1.24)
Calcium: 9 mg/dL (ref 8.9–10.3)
Glucose, Bld: 101 mg/dL — ABNORMAL HIGH (ref 65–99)
POTASSIUM: 4.5 mmol/L (ref 3.5–5.1)
Sodium: 135 mmol/L (ref 135–145)

## 2017-01-25 SURGERY — CYSTOSCOPY/RETROGRADE/URETEROSCOPY
Anesthesia: General | Laterality: Left

## 2017-01-25 SURGERY — CYSTOSCOPY, WITH RETROGRADE PYELOGRAM AND URETERAL STENT INSERTION
Anesthesia: General | Site: Urethra | Laterality: Left

## 2017-01-25 MED ORDER — CIPROFLOXACIN IN D5W 400 MG/200ML IV SOLN
400.0000 mg | Freq: Once | INTRAVENOUS | Status: AC
  Administered 2017-01-25: 400 mg via INTRAVENOUS

## 2017-01-25 MED ORDER — DEXAMETHASONE SODIUM PHOSPHATE 10 MG/ML IJ SOLN
INTRAMUSCULAR | Status: AC
  Filled 2017-01-25: qty 1

## 2017-01-25 MED ORDER — LIP MEDEX EX OINT
TOPICAL_OINTMENT | CUTANEOUS | Status: AC
  Filled 2017-01-25: qty 7

## 2017-01-25 MED ORDER — PROMETHAZINE HCL 25 MG/ML IJ SOLN: 6.2500 mg | INTRAMUSCULAR | Status: DC | PRN

## 2017-01-25 MED ORDER — FENTANYL CITRATE (PF) 100 MCG/2ML IJ SOLN
INTRAMUSCULAR | Status: AC
  Filled 2017-01-25: qty 2

## 2017-01-25 MED ORDER — MIDAZOLAM HCL 2 MG/2ML IJ SOLN
INTRAMUSCULAR | Status: DC | PRN
  Administered 2017-01-25 (×2): 1 mg via INTRAVENOUS

## 2017-01-25 MED ORDER — STERILE WATER FOR IRRIGATION IR SOLN
Status: DC | PRN
  Administered 2017-01-25: 3000 mL via INTRAVESICAL

## 2017-01-25 MED ORDER — LIDOCAINE 2% (20 MG/ML) 5 ML SYRINGE
INTRAMUSCULAR | Status: DC | PRN
  Administered 2017-01-25: 100 mg via INTRAVENOUS

## 2017-01-25 MED ORDER — CIPROFLOXACIN IN D5W 400 MG/200ML IV SOLN
INTRAVENOUS | Status: AC
  Filled 2017-01-25: qty 200

## 2017-01-25 MED ORDER — ONDANSETRON HCL 4 MG/2ML IJ SOLN
INTRAMUSCULAR | Status: DC | PRN
  Administered 2017-01-25: 4 mg via INTRAVENOUS

## 2017-01-25 MED ORDER — FENTANYL CITRATE (PF) 100 MCG/2ML IJ SOLN
INTRAMUSCULAR | Status: DC | PRN
  Administered 2017-01-25 (×2): 50 ug via INTRAVENOUS

## 2017-01-25 MED ORDER — FENTANYL CITRATE (PF) 100 MCG/2ML IJ SOLN
25.0000 ug | INTRAMUSCULAR | Status: DC | PRN
  Administered 2017-01-25: 50 ug via INTRAVENOUS

## 2017-01-25 MED ORDER — IOPAMIDOL (ISOVUE-300) INJECTION 61%
INTRAVENOUS | Status: DC | PRN
  Administered 2017-01-25: 6 mL via URETHRAL

## 2017-01-25 MED ORDER — PROPOFOL 10 MG/ML IV BOLUS
INTRAVENOUS | Status: DC | PRN
  Administered 2017-01-25: 100 mg via INTRAVENOUS
  Administered 2017-01-25: 200 mg via INTRAVENOUS

## 2017-01-25 MED ORDER — LACTATED RINGERS IV SOLN
INTRAVENOUS | Status: DC
  Administered 2017-01-25: 10:00:00 via INTRAVENOUS

## 2017-01-25 MED ORDER — PROPOFOL 10 MG/ML IV BOLUS
INTRAVENOUS | Status: AC
  Filled 2017-01-25: qty 20

## 2017-01-25 MED ORDER — LIDOCAINE 2% (20 MG/ML) 5 ML SYRINGE
INTRAMUSCULAR | Status: AC
  Filled 2017-01-25: qty 5

## 2017-01-25 MED ORDER — ONDANSETRON HCL 4 MG/2ML IJ SOLN
INTRAMUSCULAR | Status: AC
  Filled 2017-01-25: qty 2

## 2017-01-25 MED ORDER — MIDAZOLAM HCL 2 MG/2ML IJ SOLN
INTRAMUSCULAR | Status: AC
  Filled 2017-01-25: qty 2

## 2017-01-25 SURGICAL SUPPLY — 12 items
BAG URO CATCHER STRL LF (MISCELLANEOUS) ×2 IMPLANT
CATH INTERMIT  6FR 70CM (CATHETERS) ×2 IMPLANT
CLOTH BEACON ORANGE TIMEOUT ST (SAFETY) ×2 IMPLANT
COVER SURGICAL LIGHT HANDLE (MISCELLANEOUS) ×2 IMPLANT
GLOVE BIOGEL M 8.0 STRL (GLOVE) ×2 IMPLANT
GOWN STRL REUS W/ TWL XL LVL3 (GOWN DISPOSABLE) ×1 IMPLANT
GOWN STRL REUS W/TWL XL LVL3 (GOWN DISPOSABLE) ×2
GUIDEWIRE STR DUAL SENSOR (WIRE) ×2 IMPLANT
MANIFOLD NEPTUNE II (INSTRUMENTS) ×2 IMPLANT
PACK CYSTO (CUSTOM PROCEDURE TRAY) ×2 IMPLANT
STENT URET 6FRX24 CONTOUR (STENTS) ×1 IMPLANT
TUBING CONNECTING 10 (TUBING) ×2 IMPLANT

## 2017-01-25 NOTE — Progress Notes (Signed)
I continue to follow pt's progress to await medical readiness to d/c to inpt rehab. One small BM 5/23 after suppository noted. Diet liquids. Will follow. [phone]

## 2017-01-25 NOTE — Progress Notes (Signed)
PT Cancellation Note  Patient Details Name: Craig Calderon P XXXRushing MRN: [id number] DOB: 02/18/1991   Cancelled Treatment:    Reason Eval/Treat Not Completed: Patient at procedure or test/unavailable Pt [facility] to [facility] for surgery this am. Will check back this pm if time allows. Thanks.  Marvell Tamer B. Beverely RisenVan Fleet PT, DPT Acute Rehabilitation  [phone] Pager [phone]

## 2017-01-25 NOTE — Progress Notes (Signed)
Patient ID: Craig Calderon, male   DOB: 11/11/1990, 26 y.o.   MRN: [id number] Back from [facility]. I spoke with his family. CXR with smaller PTX. Since he had positive pressure ventilation will check CXR in AM and D/C CT if stable.  Violeta GelinasBurke Otie Headlee, MD, MPH, FACS Trauma: [phone] General Surgery: [phone]

## 2017-01-25 NOTE — Transfer of Care (Signed)
Immediate Anesthesia Transfer of Care Note  Patient: Craig Calderon P XXXRushing  Procedure(s) Performed: Procedure(s): CYSTOSCOPY WITH RETROGRADE PYELOGRAM/URETERAL STENT PLACEMENT (Left)  Patient Location: PACU  Anesthesia Type:General  Level of Consciousness: awake, alert , oriented and patient cooperative  Airway & Oxygen Therapy: Patient Spontanous Breathing and Patient connected to face mask oxygen  Post-op Assessment: Report given to RN and Post -op Vital signs reviewed and stable  Post vital signs: Reviewed and stable  Last Vitals:  Vitals:   01/25/17 0300 01/25/17 0940  BP: (!) 156/66 122/60  Pulse: (!) 102 (!) 103  Resp: 17 20  Temp: 37.2 C 37.3 C    Last Pain:  Vitals:   01/25/17 0940  TempSrc:   PainSc: 10-Worst pain ever      Patients Stated Pain Goal: 3 (01/24/17 0143)  Complications: No apparent anesthesia complications

## 2017-01-25 NOTE — Anesthesia Postprocedure Evaluation (Signed)
Anesthesia Post Note  Patient: [name] P XXXRushing  Procedure(s) Performed: Procedure(s) (LRB): CYSTOSCOPY WITH RETROGRADE PYELOGRAM/URETERAL STENT PLACEMENT (Left)  Patient location during evaluation: PACU Anesthesia Type: General Level of consciousness: awake and alert Pain management: pain level controlled Vital Signs Assessment: post-procedure vital signs reviewed and stable Respiratory status: spontaneous breathing, nonlabored ventilation, respiratory function stable and patient connected to nasal cannula oxygen Cardiovascular status: blood pressure returned to baseline and stable Postop Assessment: no signs of nausea or vomiting Anesthetic complications: no       Last Vitals:  Vitals:   01/25/17 1115 01/25/17 1130  BP: 139/82 (!) 140/91  Pulse: (!) 103 98  Resp: (!) 26 (!) 26  Temp:      Last Pain:  Vitals:   01/25/17 0940  TempSrc:   PainSc: 10-Worst pain ever                 Sharise Lippy S

## 2017-01-25 NOTE — Progress Notes (Signed)
Patient was transferred to [facility] transported by [facility] for a double stent placement. Patient was alert and oriented, all tubes intact.
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
## 2017-01-25 NOTE — Anesthesia Procedure Notes (Signed)
Procedure Name: LMA Insertion Date/Time: 01/25/2017 10:27 AM Performed by: Delphia GratesHANDLER, Quinnlan Abruzzo Pre-anesthesia Checklist: Emergency Drugs available, Suction available, Patient being monitored and Patient identified Patient Re-evaluated:Patient Re-evaluated prior to inductionOxygen Delivery Method: Circle system utilized Preoxygenation: Pre-oxygenation with 100% oxygen Intubation Type: IV induction LMA: LMA with gastric port inserted LMA Size: 4.0 Number of attempts: 1 Placement Confirmation: positive ETCO2 Tube secured with: Tape Dental Injury: Teeth and Oropharynx as per pre-operative assessment

## 2017-01-25 NOTE — Progress Notes (Signed)
He has no new complaints today. Specifically no new flank pain.  His vital signs remained stable although he does have some tachycardia. He has had a significant decrease in his drain output without concomitant increase in flank pain, creatinine or white blood cell count.  His CT scan has however revealed extravasation and therefore stent placement is indicated. In addition to the stent the Foley catheter will be left indwelling to completely decompress the system.  I have discussed the procedure with him and he presents today for cystoscopy, left retrograde pyelogram and left double-J stent placement.
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
## 2017-01-26 ENCOUNTER — Inpatient Hospital Stay (HOSPITAL_COMMUNITY): Payer: Medicaid Other

## 2017-01-26 ENCOUNTER — Encounter (HOSPITAL_COMMUNITY): Payer: Self-pay | Admitting: Urology

## 2017-01-26 LAB — CBC
HCT: 33.5 % — ABNORMAL LOW (ref 39.0–52.0)
Hemoglobin: 10.9 g/dL — ABNORMAL LOW (ref 13.0–17.0)
MCH: 28.8 pg (ref 26.0–34.0)
MCHC: 32.5 g/dL (ref 30.0–36.0)
MCV: 88.6 fL (ref 78.0–100.0)
PLATELETS: 868 10*3/uL — AB (ref 150–400)
RBC: 3.78 MIL/uL — AB (ref 4.22–5.81)
RDW: 14.3 % (ref 11.5–15.5)
WBC: 15.7 10*3/uL — ABNORMAL HIGH (ref 4.0–10.5)

## 2017-01-26 MED ORDER — MENINGOCOCCAL A C Y&W-135 OLIG IM SOLR
0.5000 mL | Freq: Once | INTRAMUSCULAR | Status: AC
  Administered 2017-01-26: 0.5 mL via INTRAMUSCULAR
  Filled 2017-01-26: qty 0.5

## 2017-01-26 MED ORDER — HAEMOPHILUS B POLYSAC CONJ VAC IM SOLR: 0.5000 mL | INTRAMUSCULAR | Status: DC | PRN

## 2017-01-26 MED ORDER — PNEUMOCOCCAL 13-VAL CONJ VACC IM SUSP: 0.5000 mL | INTRAMUSCULAR | Status: DC | PRN

## 2017-01-26 NOTE — Progress Notes (Signed)
I will follow up Monday to assist with planning timing for admission to inpt rehab. Pt and Mom are aware of plans for eventual admit to CIR. I am concerned for one BM since admit 5/23, noted to be very small BM per documentation.  [phone]
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
## 2017-01-26 NOTE — Progress Notes (Signed)
OT Cancellation Note  Patient Details Name: Craig Calderon MRN: [id number] DOB: 01/22/1991   Cancelled Treatment:    Reason Eval/Treat Not Completed: Other (comment) (Pt politely declined secondary to just getting back in bed and having chest tube removed.  )  Kyli Sorter OTR/L 01/26/2017, 3:05 PM
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
## 2017-01-27 ENCOUNTER — Inpatient Hospital Stay (HOSPITAL_COMMUNITY): Payer: Medicaid Other

## 2017-01-27 LAB — CBC
HEMATOCRIT: 32 % — AB (ref 39.0–52.0)
Hemoglobin: 10.2 g/dL — ABNORMAL LOW (ref 13.0–17.0)
MCH: 28.3 pg (ref 26.0–34.0)
MCHC: 31.9 g/dL (ref 30.0–36.0)
MCV: 88.9 fL (ref 78.0–100.0)
Platelets: 899 10*3/uL — ABNORMAL HIGH (ref 150–400)
RBC: 3.6 MIL/uL — ABNORMAL LOW (ref 4.22–5.81)
RDW: 14.3 % (ref 11.5–15.5)
WBC: 17 10*3/uL — ABNORMAL HIGH (ref 4.0–10.5)

## 2017-01-27 LAB — BASIC METABOLIC PANEL
ANION GAP: 8 (ref 5–15)
BUN: 21 mg/dL — ABNORMAL HIGH (ref 6–20)
CHLORIDE: 99 mmol/L — AB (ref 101–111)
CO2: 25 mmol/L (ref 22–32)
CREATININE: 1.2 mg/dL (ref 0.61–1.24)
Calcium: 9.2 mg/dL (ref 8.9–10.3)
GFR calc Af Amer: >60 mL/min (ref >60)
GFR calc non Af Amer: >60 mL/min (ref >60)
GLUCOSE: 96 mg/dL (ref 65–99)
Potassium: 4.5 mmol/L (ref 3.5–5.1)
Sodium: 132 mmol/L — ABNORMAL LOW (ref 135–145)

## 2017-01-27 MED ORDER — DOCUSATE SODIUM 50 MG/5ML PO LIQD
100.0000 mg | Freq: Two times a day (BID) | ORAL | Status: DC
  Administered 2017-01-27 – 2017-01-28 (×3): 100 mg via ORAL
  Filled 2017-01-27 (×5): qty 10
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
## 2017-01-29 ENCOUNTER — Encounter (HOSPITAL_COMMUNITY): Payer: Self-pay | Admitting: Physical Medicine and Rehabilitation

## 2017-01-29 ENCOUNTER — Inpatient Hospital Stay (HOSPITAL_COMMUNITY)
Admission: RE | Admit: 2017-01-29 | Discharge: 2017-02-22 | DRG: 052 | Disposition: A | Discharge status: Home Health | Payer: Medicaid Other | Source: Intra-hospital | Attending: Physical Medicine & Rehabilitation | Admitting: Physical Medicine & Rehabilitation

## 2017-01-29 ENCOUNTER — Encounter (HOSPITAL_COMMUNITY): Payer: Self-pay | Admitting: Emergency Medicine

## 2017-01-29 ENCOUNTER — Inpatient Hospital Stay (HOSPITAL_COMMUNITY): Payer: Medicaid Other

## 2017-01-29 DIAGNOSIS — G629 Polyneuropathy, unspecified: Secondary | ICD-10-CM | POA: Diagnosis present

## 2017-01-29 DIAGNOSIS — G8220 Paraplegia, unspecified: Secondary | ICD-10-CM | POA: Diagnosis present

## 2017-01-29 DIAGNOSIS — F4311 Post-traumatic stress disorder, acute: Secondary | ICD-10-CM | POA: Diagnosis present

## 2017-01-29 DIAGNOSIS — D62 Acute posthemorrhagic anemia: Secondary | ICD-10-CM | POA: Diagnosis present

## 2017-01-29 DIAGNOSIS — S42101D Fracture of unspecified part of scapula, right shoulder, subsequent encounter for fracture with routine healing: Secondary | ICD-10-CM | POA: Diagnosis not present

## 2017-01-29 DIAGNOSIS — T8131XA Disruption of external operation (surgical) wound, not elsewhere classified, initial encounter: Secondary | ICD-10-CM | POA: Diagnosis not present

## 2017-01-29 DIAGNOSIS — R000 Tachycardia, unspecified: Secondary | ICD-10-CM

## 2017-01-29 DIAGNOSIS — Z9081 Acquired absence of spleen: Secondary | ICD-10-CM

## 2017-01-29 DIAGNOSIS — N319 Neuromuscular dysfunction of bladder, unspecified: Secondary | ICD-10-CM

## 2017-01-29 DIAGNOSIS — F17200 Nicotine dependence, unspecified, uncomplicated: Secondary | ICD-10-CM | POA: Diagnosis present

## 2017-01-29 DIAGNOSIS — K5900 Constipation, unspecified: Secondary | ICD-10-CM | POA: Diagnosis present

## 2017-01-29 DIAGNOSIS — S24109D Unspecified injury at unspecified level of thoracic spinal cord, subsequent encounter: Secondary | ICD-10-CM

## 2017-01-29 DIAGNOSIS — S24104D Unspecified injury at T11-T12 level of thoracic spinal cord, subsequent encounter: Secondary | ICD-10-CM | POA: Diagnosis not present

## 2017-01-29 DIAGNOSIS — W3400XD Accidental discharge from unspecified firearms or gun, subsequent encounter: Secondary | ICD-10-CM

## 2017-01-29 DIAGNOSIS — D72829 Elevated white blood cell count, unspecified: Secondary | ICD-10-CM | POA: Diagnosis present

## 2017-01-29 DIAGNOSIS — K592 Neurogenic bowel, not elsewhere classified: Secondary | ICD-10-CM | POA: Diagnosis present

## 2017-01-29 DIAGNOSIS — S37012D Minor contusion of left kidney, subsequent encounter: Secondary | ICD-10-CM

## 2017-01-29 DIAGNOSIS — B9689 Other specified bacterial agents as the cause of diseases classified elsewhere: Secondary | ICD-10-CM | POA: Diagnosis present

## 2017-01-29 DIAGNOSIS — R7989 Other specified abnormal findings of blood chemistry: Secondary | ICD-10-CM | POA: Diagnosis present

## 2017-01-29 DIAGNOSIS — F4310 Post-traumatic stress disorder, unspecified: Secondary | ICD-10-CM

## 2017-01-29 DIAGNOSIS — N390 Urinary tract infection, site not specified: Secondary | ICD-10-CM | POA: Diagnosis present

## 2017-01-29 DIAGNOSIS — S24109A Unspecified injury at unspecified level of thoracic spinal cord, initial encounter: Secondary | ICD-10-CM | POA: Diagnosis present

## 2017-01-29 DIAGNOSIS — S24109S Unspecified injury at unspecified level of thoracic spinal cord, sequela: Secondary | ICD-10-CM

## 2017-01-29 DIAGNOSIS — E871 Hypo-osmolality and hyponatremia: Secondary | ICD-10-CM | POA: Diagnosis present

## 2017-01-29 DIAGNOSIS — M792 Neuralgia and neuritis, unspecified: Secondary | ICD-10-CM | POA: Diagnosis present

## 2017-01-29 DIAGNOSIS — A499 Bacterial infection, unspecified: Secondary | ICD-10-CM

## 2017-01-29 MED ORDER — POLYETHYLENE GLYCOL 3350 17 G PO PACK: 17.0000 g | PACK | Freq: Every day | ORAL | Status: DC

## 2017-01-29 MED ORDER — METHOCARBAMOL 500 MG PO TABS
500.0000 mg | ORAL_TABLET | Freq: Four times a day (QID) | ORAL | Status: DC | PRN
  Administered 2017-02-01 – 2017-02-15 (×25): 500 mg via ORAL
  Filled 2017-01-29 (×26): qty 1

## 2017-01-29 MED ORDER — ENOXAPARIN SODIUM 30 MG/0.3ML ~~LOC~~ SOLN
30.0000 mg | Freq: Two times a day (BID) | SUBCUTANEOUS | Status: DC
  Administered 2017-01-29 – 2017-02-22 (×42): 30 mg via SUBCUTANEOUS
  Filled 2017-01-29 (×48): qty 0.3

## 2017-01-29 MED ORDER — PREGABALIN 75 MG PO CAPS: 75.0000 mg | ORAL_CAPSULE | Freq: Two times a day (BID) | ORAL | Status: DC

## 2017-01-29 MED ORDER — ACETAMINOPHEN 325 MG PO TABS: 325.0000 mg | ORAL_TABLET | ORAL | Status: DC | PRN

## 2017-01-29 MED ORDER — TRAZODONE HCL 50 MG PO TABS: 25.0000 mg | ORAL_TABLET | Freq: Every evening | ORAL | Status: DC | PRN

## 2017-01-29 MED ORDER — OXYCODONE HCL 5 MG PO TABS: 5.0000 mg | ORAL_TABLET | ORAL | 0 refills | Status: DC | PRN

## 2017-01-29 MED ORDER — PROCHLORPERAZINE EDISYLATE 5 MG/ML IJ SOLN: 5.0000 mg | Freq: Four times a day (QID) | INTRAMUSCULAR | Status: DC | PRN

## 2017-01-29 MED ORDER — METHOCARBAMOL 500 MG PO TABS: 500.0000 mg | ORAL_TABLET | Freq: Four times a day (QID) | ORAL | Status: DC | PRN

## 2017-01-29 MED ORDER — LEVETIRACETAM 500 MG PO TABS: 500.0000 mg | ORAL_TABLET | Freq: Two times a day (BID) | ORAL | Status: DC

## 2017-01-29 MED ORDER — ENOXAPARIN SODIUM 40 MG/0.4ML ~~LOC~~ SOLN: 40.0000 mg | SUBCUTANEOUS | Status: DC

## 2017-01-29 MED ORDER — PROCHLORPERAZINE 25 MG RE SUPP: 12.5000 mg | Freq: Four times a day (QID) | RECTAL | Status: DC | PRN

## 2017-01-29 MED ORDER — PANTOPRAZOLE SODIUM 40 MG PO TBEC
40.0000 mg | DELAYED_RELEASE_TABLET | Freq: Every day | ORAL | Status: DC
  Administered 2017-01-30 – 2017-02-22 (×23): 40 mg via ORAL
  Filled 2017-01-29 (×24): qty 1

## 2017-01-29 MED ORDER — PREGABALIN 75 MG PO CAPS
75.0000 mg | ORAL_CAPSULE | Freq: Two times a day (BID) | ORAL | Status: DC
  Administered 2017-01-29 – 2017-02-05 (×15): 75 mg via ORAL
  Filled 2017-01-29 (×16): qty 1

## 2017-01-29 MED ORDER — LORAZEPAM 1 MG PO TABS: 1.0000 mg | ORAL_TABLET | Freq: Four times a day (QID) | ORAL | Status: DC | PRN

## 2017-01-29 MED ORDER — DIPHENHYDRAMINE HCL 12.5 MG/5ML PO ELIX
12.5000 mg | ORAL_SOLUTION | Freq: Four times a day (QID) | ORAL | Status: DC | PRN
Start: 2017-01-29 — End: 2017-01-29

## 2017-01-29 MED ORDER — FLEET ENEMA 7-19 GM/118ML RE ENEM: 1.0000 | ENEMA | Freq: Once | RECTAL | Status: DC | PRN

## 2017-01-29 MED ORDER — BISACODYL 10 MG RE SUPP
10.0000 mg | Freq: Every day | RECTAL | Status: DC
  Administered 2017-01-30 – 2017-02-22 (×22): 10 mg via RECTAL
  Filled 2017-01-29 (×24): qty 1

## 2017-01-29 MED ORDER — ENSURE ENLIVE PO LIQD
237.0000 mL | Freq: Two times a day (BID) | ORAL | Status: DC
  Administered 2017-01-30 – 2017-02-17 (×4): 237 mL via ORAL

## 2017-01-29 MED ORDER — LORAZEPAM 1 MG PO TABS: 1.0000 mg | ORAL_TABLET | Freq: Four times a day (QID) | ORAL | 0 refills | Status: DC | PRN

## 2017-01-29 MED ORDER — OXYCODONE HCL 5 MG PO TABS
10.0000 mg | ORAL_TABLET | ORAL | Status: DC | PRN
Start: 2017-01-29 — End: 2017-02-21
  Administered 2017-01-29 – 2017-01-31 (×10): 15 mg via ORAL
  Administered 2017-01-31: 10 mg via ORAL
  Administered 2017-02-01 – 2017-02-02 (×6): 15 mg via ORAL
  Administered 2017-02-02: 10 mg via ORAL
  Administered 2017-02-02 – 2017-02-16 (×60): 15 mg via ORAL
  Administered 2017-02-16: 10 mg via ORAL
  Administered 2017-02-16 – 2017-02-21 (×19): 15 mg via ORAL
  Filled 2017-01-29 (×100): qty 3

## 2017-01-29 MED ORDER — OXYCODONE HCL 5 MG PO TABS: 10.0000 mg | ORAL_TABLET | ORAL | Status: DC | PRN

## 2017-01-29 MED ORDER — GUAIFENESIN-DM 100-10 MG/5ML PO SYRP: 5.0000 mL | ORAL_SOLUTION | Freq: Four times a day (QID) | ORAL | Status: DC | PRN

## 2017-01-29 MED ORDER — POLYETHYLENE GLYCOL 3350 17 G PO PACK: 17.0000 g | PACK | Freq: Every day | ORAL | Status: DC | PRN

## 2017-01-29 MED ORDER — ACETAMINOPHEN 325 MG PO TABS
325.0000 mg | ORAL_TABLET | ORAL | Status: DC | PRN
Start: 2017-01-29 — End: 2017-02-22
  Administered 2017-02-14 – 2017-02-15 (×2): 650 mg via ORAL
  Filled 2017-01-29 (×2): qty 2

## 2017-01-29 MED ORDER — BOOST / RESOURCE BREEZE PO LIQD: 1.0000 | Freq: Every day | ORAL | 0 refills | Status: DC

## 2017-01-29 MED ORDER — ENSURE ENLIVE PO LIQD
237.0000 mL | Freq: Two times a day (BID) | ORAL | 12 refills | Status: DC
Start: 2017-01-29 — End: 2017-02-22

## 2017-01-29 MED ORDER — PROCHLORPERAZINE MALEATE 5 MG PO TABS
5.0000 mg | ORAL_TABLET | Freq: Four times a day (QID) | ORAL | Status: DC | PRN
Start: 2017-01-29 — End: 2017-01-29

## 2017-01-29 MED ORDER — BISACODYL 10 MG RE SUPP: 10.0000 mg | Freq: Every day | RECTAL | Status: DC | PRN

## 2017-01-29 MED ORDER — ALUM & MAG HYDROXIDE-SIMETH 200-200-20 MG/5ML PO SUSP: 30.0000 mL | ORAL | Status: DC | PRN

## 2017-01-29 MED ORDER — PROCHLORPERAZINE MALEATE 5 MG PO TABS: 5.0000 mg | ORAL_TABLET | Freq: Four times a day (QID) | ORAL | Status: DC | PRN

## 2017-01-29 MED ORDER — DIPHENHYDRAMINE HCL 12.5 MG/5ML PO ELIX: 12.5000 mg | ORAL_SOLUTION | Freq: Four times a day (QID) | ORAL | Status: DC | PRN

## 2017-01-29 MED ORDER — ONDANSETRON HCL 4 MG PO TABS: 4.0000 mg | ORAL_TABLET | Freq: Four times a day (QID) | ORAL | 0 refills | Status: DC | PRN

## 2017-01-29 MED ORDER — PANTOPRAZOLE SODIUM 40 MG PO TBEC: 40.0000 mg | DELAYED_RELEASE_TABLET | Freq: Every day | ORAL | Status: DC

## 2017-01-29 MED ORDER — TRAZODONE HCL 50 MG PO TABS
25.0000 mg | ORAL_TABLET | Freq: Every evening | ORAL | Status: DC | PRN
  Administered 2017-02-11: 50 mg via ORAL
  Filled 2017-01-29 (×3): qty 1

## 2017-01-29 MED ORDER — LEVETIRACETAM 500 MG PO TABS
500.0000 mg | ORAL_TABLET | Freq: Two times a day (BID) | ORAL | Status: DC
  Administered 2017-01-29 – 2017-02-22 (×43): 500 mg via ORAL
  Filled 2017-01-29 (×48): qty 1

## 2017-01-29 MED ORDER — BOOST / RESOURCE BREEZE PO LIQD
1.0000 | Freq: Every day | ORAL | Status: DC
  Administered 2017-01-29 – 2017-02-17 (×7): 1 via ORAL
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
## 2017-01-29 NOTE — Progress Notes (Signed)
I contacted Dr. Hulen Skains as well as Dr. Naaman Plummer to discuss a possible admission to inpt rehab today. Both in agreement to admit. I will make the arrangements to admit today. I met with Craig Calderon at bedside and he gave approval. RN Cm made aware as well as RN CM, Almyra Free . [phone]
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
## 2017-01-29 NOTE — Progress Notes (Signed)
Pt declined suppository and oral laxatives this morning despite maximum encouragement. Pt could not give a good excuse except to say that he did not like them. He added that the MD had ordered a BSC but attempts to explain to him that the bedside commode on its own would not make him have a bowel movement since he was using a lot of opioids medication were unsuccessful.

## 2017-01-29 NOTE — Discharge Instructions (Signed)
Go into inpatient rehabilitation, they will determine therapies.
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
## 2017-01-29 NOTE — Progress Notes (Signed)
Pt discharged to Inpatient Rehab this pm. Report called and given to receiving unit nurse with no concerns expressed. Pt medicated for pain before moving

## 2017-01-30 ENCOUNTER — Inpatient Hospital Stay (HOSPITAL_COMMUNITY): Payer: Self-pay | Admitting: Physical Therapy

## 2017-01-30 ENCOUNTER — Inpatient Hospital Stay (HOSPITAL_COMMUNITY): Payer: Medicaid Other | Admitting: Occupational Therapy

## 2017-01-30 DIAGNOSIS — D72829 Elevated white blood cell count, unspecified: Secondary | ICD-10-CM

## 2017-01-30 LAB — COMPREHENSIVE METABOLIC PANEL
ALBUMIN: 2.9 g/dL — AB (ref 3.5–5.0)
ALK PHOS: 135 U/L — AB (ref 38–126)
ALT: 95 U/L — ABNORMAL HIGH (ref 17–63)
AST: 44 U/L — ABNORMAL HIGH (ref 15–41)
Anion gap: 9 (ref 5–15)
BUN: 20 mg/dL (ref 6–20)
CHLORIDE: 99 mmol/L — AB (ref 101–111)
CO2: 23 mmol/L (ref 22–32)
CREATININE: 1.07 mg/dL (ref 0.61–1.24)
Calcium: 9.3 mg/dL (ref 8.9–10.3)
GFR calc Af Amer: >60 mL/min (ref >60)
GFR calc non Af Amer: >60 mL/min (ref >60)
Glucose, Bld: 110 mg/dL — ABNORMAL HIGH (ref 65–99)
POTASSIUM: 4.4 mmol/L (ref 3.5–5.1)
SODIUM: 131 mmol/L — AB (ref 135–145)
TOTAL PROTEIN: 7.8 g/dL (ref 6.5–8.1)
Total Bilirubin: 0.6 mg/dL (ref 0.3–1.2)

## 2017-01-30 LAB — CBC WITH DIFFERENTIAL/PLATELET
BASOS ABS: 0 10*3/uL (ref 0.0–0.1)
Basophils Relative: 0 %
EOS PCT: 2 %
Eosinophils Absolute: 0.4 10*3/uL (ref 0.0–0.7)
HCT: 34 % — ABNORMAL LOW (ref 39.0–52.0)
Hemoglobin: 11.1 g/dL — ABNORMAL LOW (ref 13.0–17.0)
LYMPHS ABS: 1.6 10*3/uL (ref 0.7–4.0)
Lymphocytes Relative: 8 %
MCH: 29.1 pg (ref 26.0–34.0)
MCHC: 32.6 g/dL (ref 30.0–36.0)
MCV: 89.2 fL (ref 78.0–100.0)
MONO ABS: 2 10*3/uL — AB (ref 0.1–1.0)
Monocytes Relative: 10 %
NEUTROS PCT: 80 %
Neutro Abs: 16.3 10*3/uL — ABNORMAL HIGH (ref 1.7–7.7)
PLATELETS: 987 10*3/uL — AB (ref 150–400)
RBC: 3.81 MIL/uL — AB (ref 4.22–5.81)
RDW: 14.4 % (ref 11.5–15.5)
WBC: 20.3 10*3/uL — ABNORMAL HIGH (ref 4.0–10.5)

## 2017-01-30 MED ORDER — POLYETHYLENE GLYCOL 3350 17 G PO PACK
17.0000 g | PACK | Freq: Two times a day (BID) | ORAL | Status: DC
  Administered 2017-01-30 – 2017-02-08 (×17): 17 g via ORAL
  Filled 2017-01-30 (×18): qty 1

## 2017-01-30 NOTE — Progress Notes (Signed)
Physical Therapy Session Note  Patient Details  Name: Craig Calderon MRN: [id number] Date of Birth: 1991/06/01  Today's Date: 01/30/2017 PT Individual Time: 2951-8841 PT Individual Time Calculation (min): 38 min   Short Term Goals: Week 1:  PT Short Term Goal 1 (Week 1): Pt will perform bed mobiilty with mod A and use of leg loops.  PT Short Term Goal 2 (Week 1): Pt will demonstrate sitting tolerance for 30 min with no change in vitals.  PT Short Term Goal 3 (Week 1): Pt will transfer from bed<>w/c mod A with slide board.  PT Short Term Goal 4 (Week 1): Pt will verbalize proper pressure relief technique and timing.  PT Short Term Goal 5 (Week 1): Pt will propel manual w/c 75 ft with S.   Skilled Therapeutic Interventions/Progress Updates:  No c/o pain.  8 minutes of session spent switching pts bed with nursing staff.  Session focus on pt education for SCI, transfers, and positioning.    PT provided education to pt regarding temperature control following SCI and positioning to reduce risk of skin breakdown.  Pt initially with very flat affect, but opens up throughout session.  Pt completed +2 slide board transfer back to bed and +2 for sit>supine.  Able to doff TLSO in long sitting with max for sitting balance and second person to help remove brace.  Pt positioned to comfort in supine with call bell in reach and needs met   Therapy Documentation Precautions:  Precautions Precautions: Fall Required Braces or Orthoses: Spinal Brace Spinal Brace: Thoracolumbosacral orthotic, Applied in sitting position Restrictions Weight Bearing Restrictions: No   See Function Navigator for Current Functional Status.   Therapy/Group: Individual Therapy  Earnest Conroy Penven-Crew 01/30/2017, 4:53 PM

## 2017-01-30 NOTE — Progress Notes (Signed)
Physical Therapy Session Note  Patient Details  Name: Craig Calderon MRN: [id number] Date of Birth: 22-Jul-1991  Today's Date: 01/30/2017 PT Individual Time: 1300-1400 PT Individual Time Calculation (min): 60 min   Skilled Therapeutic Interventions/Progress Updates:  Pt presented in bed agreeable to therapy.  BP at beginning of session in supine 143/92. Total assist threading shorts, required maxA rolling L/R for LB dressing. MaxA supine to sit at EOB. Pt initially requiring mod A for truncal support fade to min guard. ModA donning shirt, pt indicating more limited by pain at abdominal incision and flank pain. PTA provided total assist for donning TLSO. Pt able to tolerate sitting at EOB x 5 min unsupported with min guard. Pt instructed in SB transfer and set up. Pt performed SB transfer to Luther chair mod/maxA with max cues for sequencing and use of head/hips relationship. Pt required maxA for boosting in chair however able to use UE to push up to facilitate buttock clearance. Pt transported to rehab gym and PTA adjusted chair for improved positioning. Use of pool noodle for BLE to improve neutral positioning. Pt returned to room and encouraged to remain in TIS until next session if possible (approx 2 hours), pt agreeable. BP at end of session 145/78 and pt left with family in room and current needs met.      Therapy Documentation Precautions:  Precautions Precautions: Fall Required Braces or Orthoses: Spinal Brace Spinal Brace: Thoracolumbosacral orthotic, Applied in sitting position Restrictions Weight Bearing Restrictions: No General: Chart Reviewed: Yes Family/Caregiver Present: No Vital Signs:  Pain: Pain Assessment Pain Location: Chest (at chest tube site) Mobility:   Locomotion :    Trunk/Postural Assessment :    Balance: Static Sitting Balance Static Sitting - Level of Assistance: 5: Stand by assistance Dynamic Sitting Balance Dynamic Sitting - Level of Assistance: 4:  Min assist Exercises:   Other Treatments:     See Function Navigator for Current Functional Status.   Therapy/Group: Individual Therapy  Craig Calderon 01/30/2017, 3:38 PM
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
## 2017-01-30 NOTE — Progress Notes (Signed)
Social Work Patient ID: Christin FudgeAndre P XXXRushing, male   DOB: 05/25/1991, 26 y.o.   MRN: [id number]   CSW attempted to see pt twice today without success (once pt did not awake when CSW entered room and another time pt had visitors during his lunch break).  CSW will continue to reach out to pt to meet him and offer support.  CSW remains available as needed.
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
## 2017-01-31 ENCOUNTER — Inpatient Hospital Stay (HOSPITAL_COMMUNITY): Payer: Medicaid Other | Admitting: Occupational Therapy

## 2017-01-31 ENCOUNTER — Encounter (HOSPITAL_COMMUNITY): Payer: Self-pay | Admitting: Psychology

## 2017-01-31 ENCOUNTER — Inpatient Hospital Stay (HOSPITAL_COMMUNITY): Payer: Self-pay | Admitting: Physical Therapy

## 2017-01-31 ENCOUNTER — Inpatient Hospital Stay (HOSPITAL_COMMUNITY): Payer: Self-pay

## 2017-01-31 ENCOUNTER — Inpatient Hospital Stay (HOSPITAL_COMMUNITY): Payer: Self-pay | Admitting: Occupational Therapy

## 2017-01-31 DIAGNOSIS — D72828 Other elevated white blood cell count: Secondary | ICD-10-CM

## 2017-01-31 DIAGNOSIS — F4310 Post-traumatic stress disorder, unspecified: Secondary | ICD-10-CM

## 2017-01-31 DIAGNOSIS — S24109D Unspecified injury at unspecified level of thoracic spinal cord, subsequent encounter: Secondary | ICD-10-CM

## 2017-01-31 LAB — PATHOLOGIST SMEAR REVIEW

## 2017-01-31 LAB — URINALYSIS, COMPLETE (UACMP) WITH MICROSCOPIC
GLUCOSE, UA: NEGATIVE mg/dL
KETONES UR: NEGATIVE mg/dL
Nitrite: POSITIVE — AB
PH: 5.5 (ref 5.0–8.0)
Protein, ur: 100 mg/dL — AB

## 2017-01-31 MED ORDER — BACITRACIN ZINC 500 UNIT/GM EX OINT
TOPICAL_OINTMENT | Freq: Two times a day (BID) | CUTANEOUS | Status: DC
  Administered 2017-01-31 (×2): via TOPICAL
  Administered 2017-02-01: 1 via TOPICAL
  Administered 2017-02-01 – 2017-02-14 (×13): via TOPICAL
  Administered 2017-02-14: 1 via TOPICAL
  Administered 2017-02-14 – 2017-02-22 (×9): via TOPICAL
  Filled 2017-01-31 (×9): qty 28.35

## 2017-01-31 MED ORDER — SORBITOL 70 % SOLN
60.0000 mL | Status: AC
  Administered 2017-01-31: 60 mL via ORAL
  Filled 2017-01-31: qty 60

## 2017-01-31 NOTE — Progress Notes (Signed)
Physical Therapy Session Note  Patient Details  Name: Craig Calderon MRN: [id number] Date of Birth: 08/05/1991  Today's Date: 01/31/2017 PT Individual Time: 1300-1400 PT Individual Time Calculation (min): 60 min   Short Term Goals: Week 1:  PT Short Term Goal 1 (Week 1): Pt will perform bed mobiilty with mod A and use of leg loops.  PT Short Term Goal 2 (Week 1): Pt will demonstrate sitting tolerance for 30 min with no change in vitals.  PT Short Term Goal 3 (Week 1): Pt will transfer from bed<>w/c mod A with slide board.  PT Short Term Goal 4 (Week 1): Pt will verbalize proper pressure relief technique and timing.  PT Short Term Goal 5 (Week 1): Pt will propel manual w/c 75 ft with S.   Skilled Therapeutic Interventions/Progress Updates: Pt received supine in bed, lethargic and slow to arouse, then ultimately agreeable to treatment. Pt c/o pain as below however mostly complaining of fatigue, low energy and reports he has not been eating much, states he doesn't have much of an appetite. Per OT, in earlier session pt again asking about his ability to walk when he leaves and had a long discussion about prognosis and pt very upset about this. PROM to BLE x30-60 sec per stretch at gastroc, soleus, hamstring, hip external rotation. Educated pt on longterm goal of independence with LE ROM HEP. Pt engaged in BUE strengthening exercises with 6# free weights, green theraband; chest press, pec fly, tricep extension in supine, rowing with theraband, tricep extension. Scooting toward HOB using BUE on bedrails and modA +2. Remained supine in bed with handoff to OT for next session.      Therapy Documentation Precautions:  Precautions Precautions: Fall Required Braces or Orthoses: Spinal Brace Spinal Brace: Thoracolumbosacral orthotic, Applied in sitting position Restrictions Weight Bearing Restrictions: No Pain: Pain Assessment Pain Assessment: Faces Faces Pain Scale: Hurts whole lot Pain  Location: Abdomen Pain Descriptors / Indicators: Aching;Grimacing;Sore;Operative site guarding Pain Intervention(s): Repositioned;Distraction;Emotional support 2nd Pain Site Wong-Baker Pain Rating: Hurts even more Pain Type: Acute pain Pain Location: Rib cage Pain Orientation: Left Pain Descriptors / Indicators: Aching;Grimacing;Sore Pain Onset: Sudden (when hiccuping) Pain Intervention(s): Repositioned;Distraction;Emotional support  See Function Navigator for Current Functional Status.   Therapy/Group: Individual Therapy  Vista Lawmanlizabeth J Tygielski 01/31/2017, 2:45 PM

## 2017-01-31 NOTE — Progress Notes (Signed)
Occupational Therapy Session Note  Patient Details  Name: Craig Calderon P XXXRushing MRN: [id number] Date of Birth: 02/18/1991  Today's Date: 01/31/2017 OT Individual Time: [id number] OT Individual Time Calculation (min): 24 min    Short Term Goals: Week 1:  OT Short Term Goal 1 (Week 1): Pt will be able to tolerate sitting on EOB for 10 min to wash UB and don TLSO with close S.  OT Short Term Goal 2 (Week 1): Pt will be able to wash 75% of his lower legs with set up A from bed level. OT Short Term Goal 3 (Week 1): Pt will be able to thread his pants over his feet with adaptive techniques with min A.   OT Short Term Goal 4 (Week 1): Pt will be able to transfer to W/c with sliding board with mod A of 1 to prepare for grooming at the sink.  Skilled Therapeutic Interventions/Progress Updates:    Pt seen for OT session focusing on prep for ADL re-training including management of LEs. Pt received in supine with hand off from PT, pt declining OOB tx with PT. He was agreeable to OT session from bed level and denied pain. Education and demonstration provided for use of circle sitting and long sitting positions in prep for LB bathing/dressing tasks. With increased time and multimodal cuing, pt able to manage R LE to reach foot in order to don and doff sock. Attempted with L LE, however, due to fatigue and hip tightness pt unable to reach L foot. Attempted to obtain long sitting position with total A +2, however, pt unable to tolerate anterior weight shift needed to obtain long sitting balance point. Pt began to fatigue, difficulty keeping eyes open and declining remainder of tx session. Pt left in supine with all needs in reach.   Therapy Documentation Precautions:  Precautions Precautions: Fall Required Braces or Orthoses: Spinal Brace Spinal Brace: Thoracolumbosacral orthotic, Applied in sitting position Restrictions Weight Bearing Restrictions: No ADL: ADL ADL Comments: refer to functional  navigator  See Function Navigator for Current Functional Status.   Therapy/Group: Individual Therapy  Lewis, Zyair Rhein C 01/31/2017, 2:55 PM

## 2017-01-31 NOTE — Progress Notes (Signed)
Occupational Therapy Session Note  Patient Details  Name: [name] P XXXRushing MRN: [id number] Date of Birth: 05/05/1991  Today's Date: 01/31/2017 OT Individual Time: 0902-1030 OT Individual Time Calculation (min): 88 min    Short Term Goals: Week 1:  OT Short Term Goal 1 (Week 1): Pt will be able to tolerate sitting on EOB for 10 min to wash UB and don TLSO with close S.  OT Short Term Goal 2 (Week 1): Pt will be able to wash 75% of his lower legs with set up A from bed level. OT Short Term Goal 3 (Week 1): Pt will be able to thread his pants over his feet with adaptive techniques with min A.   OT Short Term Goal 4 (Week 1): Pt will be able to transfer to W/c with sliding board with mod A of 1 to prepare for grooming at the sink.  Skilled Therapeutic Interventions/Progress Updates:    Upon entering the room, pt supine in bed with c/o pain in abdomen. RN notified and pain medication given this session. OT pulling pt's LE's into circle sitting for self care and educating pt on progression in therapy for pt to be able to perform task himself to increase independence. Pt rolling L  <> R with +2 assistance for hygiene and LB dressing. Supine >sit with +2 assistance for safety. Pt requiring min - max A for sitting balance. B UE task of donning shirt without support requiring max A. Pt seated on EOB for 11 minutes for task. Pt returning to supine secondary to fatigue. Call bell and all needed items within reach upon exiting the room.   Therapy Documentation Precautions:  Precautions Precautions: Fall Required Braces or Orthoses: Spinal Brace Spinal Brace: Thoracolumbosacral orthotic, Applied in sitting position Restrictions Weight Bearing Restrictions: No General:   Vital Signs: Therapy Vitals Temp: 97.5 F (36.4 C) Temp Source: Oral Pulse Rate: (!) 109 Resp: 18 BP: (!) 149/59 Patient Position (if appropriate): Lying Oxygen Therapy SpO2: 98 % O2 Device: Not Delivered Pain:    ADL: ADL ADL Comments: refer to functional navigator  See Function Navigator for Current Functional Status.   Therapy/Group: Individual Therapy  Alen BleacherBradsher, Jaziah Goeller P 01/31/2017, 5:24 PM

## 2017-01-31 NOTE — Progress Notes (Signed)
Occupational Therapy Session Note  Patient Details  Name: Craig Calderon MRN:Craig Calderon [id number] Date of Birth: 02/05/1991  Today's Date: 01/31/2017 OT Individual Time: 1030-1130 OT Individual Time Calculation (min): 60 min    Short Term Goals: Week 1:  OT Short Term Goal 1 (Week 1): Pt will be able to tolerate sitting on EOB for 10 min to wash UB and don TLSO with close S.  OT Short Term Goal 2 (Week 1): Pt will be able to wash 75% of his lower legs with set up A from bed level. OT Short Term Goal 3 (Week 1): Pt will be able to thread his pants over his feet with adaptive techniques with min A.   OT Short Term Goal 4 (Week 1): Pt will be able to transfer to W/c with sliding board with mod A of 1 to prepare for grooming at the sink.  Skilled Therapeutic Interventions/Progress Updates:  Pt received supine in bed sleeping. Session focused on UE strengthening and discussion of plans/expectations during IP rehab stay. Pt completed session with HOB raised to sitting position, modifying level of incline based upon Pt comfort and pain levels in abdominal area. Pt able to achieve modified circle sitting with HOB elevated, use of handrails and with 2 person assist with Pt holding position approx 30-45 seconds before requiring rest break. Pt completed bilateral UE exercises at bed level with HOB raised to sitting position. Completed exercises with 1 kg and 2 kg exercise ball, including raising and lowering 1 kg ball with UEs extended forward, 10-12 reps for 3 sets; elbow extension forward, flexion back to chest using 1 kg ball (12 reps), progressing to 2 kg ball (8 reps), for 3 sets total. Rest breaks completed between each set of exercises. Discussed with Pt goals to be addressed during rehab stay prior to discharge home with Pt verbalizing understanding. Pt feeling nauseous toward end of session with RN made aware, vitals monitored and WNL. Pt left supine in bed with HOB slightly elevated, call bell and needs  within reach.      Therapy Documentation Precautions:  Precautions Precautions: Fall Required Braces or Orthoses: Spinal Brace Spinal Brace: Thoracolumbosacral orthotic, Applied in sitting position Restrictions Weight Bearing Restrictions: No  Pain: Pain Assessment Pain Assessment: Faces Pain Score:  Faces Pain Scale: Hurts whole lot Pain Type: Acute pain Pain Location: Abdomen Pain Descriptors / Indicators: Aching;Grimacing;Sore;Operative site guarding Pain Frequency: Constant Pain Onset: On-going Pain Intervention(s): Repositioned;Distraction;Emotional support 2nd Pain Site Wong-Baker Pain Rating: Hurts even more Pain Type: Acute pain Pain Location: Rib cage Pain Orientation: Left Pain Descriptors / Indicators: Aching;Grimacing;Sore Pain Onset: Sudden (when hiccuping) Pain Intervention(s): Repositioned;Distraction;Emotional support  See Function Navigator for Current Functional Status.   Therapy/Group: Individual Therapy  Orlando PennerBreanna L Campbell 01/31/2017, 11:46 AM
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
## 2017-02-01 ENCOUNTER — Inpatient Hospital Stay (HOSPITAL_COMMUNITY): Payer: Medicaid Other | Admitting: Occupational Therapy

## 2017-02-01 ENCOUNTER — Inpatient Hospital Stay (HOSPITAL_COMMUNITY): Payer: Self-pay | Admitting: Physical Therapy

## 2017-02-01 ENCOUNTER — Inpatient Hospital Stay (HOSPITAL_COMMUNITY): Payer: Medicaid Other

## 2017-02-01 ENCOUNTER — Inpatient Hospital Stay (HOSPITAL_COMMUNITY): Payer: Self-pay

## 2017-02-01 DIAGNOSIS — A499 Bacterial infection, unspecified: Secondary | ICD-10-CM

## 2017-02-01 DIAGNOSIS — N390 Urinary tract infection, site not specified: Secondary | ICD-10-CM

## 2017-02-01 DIAGNOSIS — F4310 Post-traumatic stress disorder, unspecified: Secondary | ICD-10-CM

## 2017-02-01 DIAGNOSIS — M7989 Other specified soft tissue disorders: Secondary | ICD-10-CM

## 2017-02-01 LAB — CBC
HEMATOCRIT: 33.9 % — AB (ref 39.0–52.0)
Hemoglobin: 11 g/dL — ABNORMAL LOW (ref 13.0–17.0)
MCH: 28.5 pg (ref 26.0–34.0)
MCHC: 32.4 g/dL (ref 30.0–36.0)
MCV: 87.8 fL (ref 78.0–100.0)
Platelets: 1078 10*3/uL (ref 150–400)
RBC: 3.86 MIL/uL — ABNORMAL LOW (ref 4.22–5.81)
RDW: 14.1 % (ref 11.5–15.5)
WBC: 20.7 10*3/uL — AB (ref 4.0–10.5)

## 2017-02-01 MED ORDER — CEPHALEXIN 250 MG PO CAPS
500.0000 mg | ORAL_CAPSULE | Freq: Three times a day (TID) | ORAL | Status: AC
  Administered 2017-02-01 – 2017-02-07 (×21): 500 mg via ORAL
  Filled 2017-02-01 (×21): qty 2
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
## 2017-02-01 NOTE — Progress Notes (Signed)
Occupational Therapy Note  Patient Details  Name: Craig Calderon MRN: [id number] Date of Birth: 01/20/1991  Today's Date: 02/01/2017 OT Individual Time: 1300-1400 OT Individual Time Calculation (min): 60 min   Pt c/o increased abdominal pain (staples pulling) when HOB elevated and pt assisted in unsupported sitting; RN aware and repositioned Individual Therapy  Pt resting in bed upon arrival.  Initial focus on bed mobility and tolerance with HOB elevated.  Pt tolerates sitting with HOB elevated when increased elevation is performed in incremental segments.  Pt practiced doffing/donning socks in modified circle sitting with HOB elevated and pt in supported sitting.  Continued education regarding BADL retraining.  Focus transitioned to BUE therex with Level 2 Theraband and 3 kg ball. Pt remained in bed eating lunch and all needs within reach.    Lavone NeriLanier, Cardin Nitschke [facility] 02/01/2017, 3:37 PM

## 2017-02-01 NOTE — Progress Notes (Signed)
VASCULAR LAB PRELIMINARY  PRELIMINARY  PRELIMINARY  PRELIMINARY  Bilateral lower extremity venous duplex completed.    Preliminary report:  Bilateral:  No evidence of DVT, superficial thrombosis, or Baker's Cyst.   Craig Calderon, RVS 02/01/2017, 5:16 PM

## 2017-02-01 NOTE — IPOC Note (Signed)
Overall Plan of Care [facility](IPOC) Patient Details Name: Craig Calderon P XXXRushing MRN: [id number] DOB: 12/14/1990  Admitting Diagnosis: spinal cord inj  Hospital Problems: Active Problems:   Spinal cord injury, thoracic region [facility](HCC)   PTSD (post-traumatic stress disorder)     Functional Problem List: Nursing    PT Balance, Endurance, Motor, Sensory, Pain, Skin Integrity  OT Balance, Motor, Endurance, Pain, Sensory  SLP    TR         Basic ADL's: OT Bathing, Dressing, Toileting     Advanced  ADL's: OT       Transfers: PT Bed Mobility, Bed to Chair, Car, Occupational psychologisturniture  OT Toilet, Research scientist (life sciences)Tub/Shower     Locomotion: PT Wheelchair Mobility     Additional Impairments: OT None  SLP        TR      Anticipated Outcomes Item Anticipated Outcome  Self Feeding Independent  Swallowing      Basic self-care  set up A   Toileting  set up A   Bathroom Transfers set up A   Bowel/Bladder     Transfers  S bed mobility, min A car transfers, S w/c<>bed  Locomotion  mod I w/c level  Communication     Cognition     Pain     Safety/Judgment      Therapy Plan: PT Intensity: Minimum of 1-2 x/day ,45 to 90 minutes PT Frequency: 5 out of 7 days PT Duration Estimated Length of Stay: 3.5 weeks OT Intensity: Minimum of 1-2 x/day, 45 to 90 minutes OT Frequency: 5 out of 7 days OT Duration/Estimated Length of Stay: 28-30 days         Team Interventions: Nursing Interventions    PT interventions Balance/vestibular training, Cognitive remediation/compensation, Community reintegration, Discharge planning, Disease management/prevention, DME/adaptive equipment instruction, Functional electrical stimulation, Functional mobility training, Neuromuscular re-education, Pain management, Patient/family education, Skin care/wound management, Psychosocial support, Splinting/orthotics, Therapeutic Activities, Therapeutic Exercise, UE/LE Strength taining/ROM, UE/LE Coordination activities, Visual/perceptual  remediation/compensation, Wheelchair propulsion/positioning  OT Interventions Warden/rangerBalance/vestibular training, Discharge planning, DME/adaptive equipment instruction, Neuromuscular re-education, Functional mobility training, Pain management, Patient/family education, Psychosocial support, Self Care/advanced ADL retraining, Therapeutic Activities, Therapeutic Exercise, UE/LE Strength taining/ROM, Wheelchair propulsion/positioning  SLP Interventions    TR Interventions    SW/CM Interventions Discharge Planning, Psychosocial Support, Patient/Family Education    Team Discharge Planning: Destination: PT-Home ,OT- Home , SLP-  Projected Follow-up: PT-Home health PT, 24 hour supervision/assistance, OT-  Home health OT, SLP-  Projected Equipment Needs: PT-Wheelchair (measurements), Wheelchair cushion (measurements), Sliding board, OT- Tub/shower bench, SLP-  Equipment Details: PT- , OT-  Patient/family involved in discharge planning: PT- Patient,  OT-Patient, SLP-   MD ELOS: 24-28 days Medical Rehab Prognosis:  Excellent Assessment: The patient has been admitted for CIR therapies with the diagnosis of T12 SCI with polytrauma. The team will be addressing functional mobility, strength, stamina, balance, safety, adaptive techniques and equipment, self-care, bowel and bladder mgt, patient and caregiver education, wound care, pain mgt, ortho precautions, mood/adjustment issues, community reintegration. Goals have been set at supervision to mod I with transfers and w/c mobility and set up for ADL's. Mood is playing a big factor at this point.    Ranelle OysterZachary T. Bing Duffey, MD, FAAPMR      See Team Conference Notes for weekly updates to the plan of care
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
## 2017-02-01 NOTE — Progress Notes (Signed)
Physical Therapy Session Note  Patient Details  Name: Craig Calderon MRN: [id number] Date of Birth: 1991-06-04  Today's Date: 02/01/2017 PT Individual Time: 0800-0900 and 1015-1100 PT Individual Time Calculation (min): 60 min and 45 min (total 105 min)   Short Term Goals: Week 1:  PT Short Term Goal 1 (Week 1): Pt will perform bed mobiilty with mod A and use of leg loops.  PT Short Term Goal 2 (Week 1): Pt will demonstrate sitting tolerance for 30 min with no change in vitals.  PT Short Term Goal 3 (Week 1): Pt will transfer from bed<>w/c mod A with slide board.  PT Short Term Goal 4 (Week 1): Pt will verbalize proper pressure relief technique and timing.  PT Short Term Goal 5 (Week 1): Pt will propel manual w/c 75 ft with S.   Skilled Therapeutic Interventions/Progress Updates: Tx 1: Pt received supine in bed, denies pain initially however later reports to RN 10/10 pain as described below. Pt verbalizing frustration with night staff that he felt "acted like the didn't want to come in my room when I asked for help" and c/o pain throughout the night. Offered emotional support, discussed with RN and passed along to pt that PRN medications are limited to q4 and that they may be held when asked because he is not due for any medications yet. Pt stating he wants to get OOB and he is tired of laying down and feels uncomfortable. Supine>sit with HOB elevated and bedrails with maxA. Sitting balance min/modA on EOB with BUE support while therapist donned TLSO and TEDs totalA. Transfer bed>w/c with maxA with pt maintaining vertical body position throughout. Transported to gym totalA in tilt-in-space w/c. Educated pt on head/hips relationship in reducing energy expenditure, improving hip clearance over board for skin protection; demonstrated therapist performing transfer with forward trunk lean and use of momentum. Transfer with slight downhill to mat table with transfer board and modA, improving body  mechanics. Seated on edge of mat pt reporting feeling nauseous after taking medication and not eating breakfast; sat reclined several minutes while PT student obtained ginger ale and assessed BP 113/73 HR 122bpm with no lightheadedness of dizziness reported. Returned to w/c with minA at level surface height. Transfer back to bed after incontinent bowel movement, modA with transfer board. Rolling R/L with maxA and bedrails for therapist to change brief totalA. RN alerted to skin breakdown at intergluteal cleft who arrived to room to assess; handoff to OT to complete changing brief and donning clothing.   Tx 2: Pt received supine in bed, missed 15 min while eating breakfast due to nausea with medications. Supine>sit with log roll and maxA. Seated on EOB, min guard static sitting balance while therapist donned TLSO totalA. Transfer bed <>w/c min/modA with cues for anterior weight shift. W/c propulsion in manual chair with BUE and S; min cues for technique and safety in chair. Transfer w/c <>car with maxA for board placement, progressing hips and LEs in/out of car. Returned to bed with lateral scoot as above, modA. MaxA Sit >supine with log roll and therapist managing LEs. Discussed with pt importance of advocating for himself while in rehab and when returning home to direct care, request assistance as needed including changing position and cleaning after incontinence. Remained supine in bed at end of session, all needs in reach.      Therapy Documentation Precautions:  Precautions Precautions: Fall Required Braces or Orthoses: Spinal Brace Spinal Brace: Thoracolumbosacral orthotic, Applied in sitting position Restrictions Weight  Bearing Restrictions: No General: PT Amount of Missed Time (min): 15 Minutes PT Missed Treatment Reason: Other (Comment) (eating breakfast ) Pain: Pain Assessment Pain Assessment: 0-10 Pain Score: 10-Worst pain ever Pain Type: Surgical pain Pain Location: Abdomen Pain  Orientation: Anterior;Mid Pain Descriptors / Indicators: Aching;Sore Pain Frequency: Constant Pain Onset: On-going Patients Stated Pain Goal: 4 Pain Intervention(s): Medication (See eMAR)   See Function Navigator for Current Functional Status.   Therapy/Group: Individual Therapy  Vista Lawmanlizabeth J Tygielski 02/01/2017, 9:49 AM

## 2017-02-02 ENCOUNTER — Inpatient Hospital Stay (HOSPITAL_COMMUNITY): Payer: Self-pay | Admitting: Physical Therapy

## 2017-02-02 ENCOUNTER — Inpatient Hospital Stay (HOSPITAL_COMMUNITY): Payer: Self-pay

## 2017-02-02 DIAGNOSIS — N390 Urinary tract infection, site not specified: Secondary | ICD-10-CM

## 2017-02-02 DIAGNOSIS — A499 Bacterial infection, unspecified: Secondary | ICD-10-CM

## 2017-02-02 HISTORY — DX: Urinary tract infection, site not specified: N39.0

## 2017-02-02 LAB — URINE CULTURE: Culture: >=100000 — AB

## 2017-02-02 MED ORDER — NORTRIPTYLINE HCL 10 MG PO CAPS
10.0000 mg | ORAL_CAPSULE | Freq: Every day | ORAL | Status: DC
  Administered 2017-02-02: 10 mg via ORAL
  Filled 2017-02-02: qty 1

## 2017-02-02 NOTE — Progress Notes (Signed)
Occupational Therapy Note  Patient Details  Name: Craig Calderon MRN: [id number] Date of Birth: 10/27/1990  Today's Date: 02/02/2017 OT Individual Time: 1345-1430 OT Individual Time Calculation (min): 45 min   Pt denied pain Individual Therapy  Pt resting in bed upon arrival.  Pt stated he was ready to get out of his bed.  Focus on pt directing care and providing instructions/directions to therapist in regards to sitting EOB in preparation for SBT to w/c.  Pt required assistance moving BLE over edge of bed and required max A for supine>sit with HOB elevated approx 45 degrees. Pt performed SBT to w/c with min A and min A for repositioning in w/c.  Pt propelled to sink and completed grooming tasks.  Pt propelled to ADL apt for ongoing education.  Demonstrated tub bench transfer.  Educated pt on use of leg loops (only one available) and demonstrated use. Pt remained in w/c with all needs within reach.    Lavone NeriLanier, Breauna Mazzeo [facility] 02/02/2017, 2:54 PM

## 2017-02-02 NOTE — Progress Notes (Addendum)
Physical Therapy Session Note  Patient Details  Name: Craig Calderon P XXXRushing MRN: [id number] Date of Birth: 01/30/1991  Today's Date: 02/02/2017 PT Individual Time: 0800-0900 and 1445-1530 PT Individual Time Calculation (min): 60 min and 45 min (total 105 min)   Short Term Goals: Week 1:  PT Short Term Goal 1 (Week 1): Pt will perform bed mobiilty with mod A and use of leg loops.  PT Short Term Goal 2 (Week 1): Pt will demonstrate sitting tolerance for 30 min with no change in vitals.  PT Short Term Goal 3 (Week 1): Pt will transfer from bed<>w/c mod A with slide board.  PT Short Term Goal 4 (Week 1): Pt will verbalize proper pressure relief technique and timing.  PT Short Term Goal 5 (Week 1): Pt will propel manual w/c 75 ft with S.   Skilled Therapeutic Interventions/Progress Updates: Tx 1: Pt received supine in bed, c/o pain as below and agreeable to treatment. Pt reports he may need brief changed; states he had small bowel movement earlier but per NT may not have been finished yet. Rolling R/L with bedrails and maxA to assess; brief still clean and change not needed at this time. Educated pt on goal of establishing a bowel program with suppository placement and [facility] each day. Modified long sitting with HOB elevated pt dons shorts by pulling LEs into circle sitting with increased time; requires assist to pull pants over L hip with rolling side to side, pt manages R hip. L sidelying>sit modA with pt reporting increased pain in L side. Transfer w/c <>bed with min guard/minA at level surface height, requires assistance for board placement and continued cueing for head/hips and increase clearance of hips over board to reduce sheering. W/c propulsion x75' with BUE before fatigued. Transfer w/c <>mat table min guard level surface height. Static sitting balance with progression of UEs from edge of mat>hands in lap>hands over head>ball tossing/catching with BUE. Educated pt on  importance of head position to affect/correct balance with evidence of pt trialing change in head position to assist with preventing LOB. PT student behind pt with stability ball to provide min guard/occaisonal minA for LOBs anterior/posterior while therapist in front of pt engaging in activity. Returned to bed at end of session as above; remained supine in bed with all needs in reach at completion of session.   Tx 2: Pt received seated in w/c, denies pain and agreeable to treatment. Transported in w/c totalA for energy conservation. Transfer w/c <>mat table min guard/minA with cues for technique and pt directing care for w/c and transfer board setup. Sit >supine maxA +2 for upper body and LE management with increasing pain with activity. Once pt supine, noted ot have incontinent bowel movement. Propped on wedge for comfort d/t abdominal pain. Rolling R/L with max/totalA to change brief and pants totalA. Handoff to PT for next therapy session.      Therapy Documentation Precautions:  Precautions Precautions: Fall Required Braces or Orthoses: Spinal Brace Spinal Brace: Thoracolumbosacral orthotic, Applied in sitting position Restrictions Weight Bearing Restrictions: No Pain: Pain Assessment Pain Assessment: 0-10 Pain Score: 8  Faces Pain Scale: No hurt Pain Type: Acute pain Pain Location: Abdomen Pain Orientation: Mid Pain Descriptors / Indicators: Aching Pain Frequency: Intermittent Pain Onset: Gradual Patients Stated Pain Goal: 2 Pain Intervention(s): Medication (See eMAR)   See Function Navigator for Current Functional Status.   Therapy/Group: Individual Therapy  Vista Lawmanlizabeth J Tygielski 02/02/2017, 7:44 AM

## 2017-02-02 NOTE — Progress Notes (Signed)
[facility] Individual Statement of Services  Patient Name:  Craig Calderon  Date:  02/02/2017  Welcome to the [facility].  Our goal is to provide you with an individualized program based on your diagnosis and situation, designed to meet your specific needs.  With this comprehensive rehabilitation program, you will be expected to participate in at least 3 hours of rehabilitation therapies Monday-Friday, with modified therapy programming on the weekends.  Your rehabilitation program will include the following services:  Physical Therapy (PT), Occupational Therapy (OT), 24 hour per day rehabilitation nursing, Neuropsychology, Case Management (Social Worker), Rehabilitation Medicine, Nutrition Services and Pharmacy Services  Weekly team conferences will be held on Tuesdays to discuss your progress.  Your Social Worker will talk with you frequently to get your input and to update you on team discussions.  Team conferences with you and your family in attendance may also be held.  Expected length of stay: 3 1/2 weeks  Overall anticipated outcome: Supervision for activities of daily living; modified independence via wheelchair; minimal assistance for car transfers  Depending on your progress and recovery, your program may change. Your Social Worker will coordinate services and will keep you informed of any changes. Your Social Worker's name and contact numbers are listed  below.  The following services may also be recommended but are not provided by the [facility]:   Driving Evaluations  Home Health Rehabiltiation Services  Outpatient Rehabilitation Services  Vocational Rehabilitation   Arrangements will be made to provide these services after discharge if needed.  Arrangements include referral to agencies that provide these services.  Your insurance has been verified to be:  None at this time; Pt has applied for Medicaid Your  primary doctor is:  You do not have one, but I can help you to find primary care follow up for after discharge from the hospital.  Pertinent information will be shared with your doctor and your insurance company.  Social Worker:  [name], LCSW  [phone] or ([phone]) [phone]  Information discussed with and copy given to patient by: [name], [name], 02/02/2017, 1:31 AM
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
## 2017-02-02 NOTE — Progress Notes (Signed)
Social Work Patient ID: Craig Calderon, male   DOB: Feb 21, 1991, 26 y.o.   MRN: [id number]   CSW met with pt to update him on team conference discussion and to complete assessment.  Pt was in and out of sleep during visit and we briefly discussed team conference.  CSW will meet with pt again to make sure pt knows of targeted d/c date and goals set.  Pt stated he has a handful of people who will be helping him at home.  CSW will continue to follow and assist as needed.
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
## 2017-02-02 NOTE — Progress Notes (Signed)
Occupational Therapy Session Note  Patient Details  Name: Craig Calderon MRN: [id number] Date of Birth: 01/29/1991  Today's Date: 02/02/2017 OT Individual Time: [id number] OT Individual Time Calculation (min): 60 min    Short Term Goals: Week 1:  OT Short Term Goal 1 (Week 1): Pt will be able to tolerate sitting on EOB for 10 min to wash UB and don TLSO with close S.  OT Short Term Goal 2 (Week 1): Pt will be able to wash 75% of his lower legs with set up A from bed level. OT Short Term Goal 3 (Week 1): Pt will be able to thread his pants over his feet with adaptive techniques with min A.   OT Short Term Goal 4 (Week 1): Pt will be able to transfer to W/c with sliding board with mod A of 1 to prepare for grooming at the sink.  Skilled Therapeutic Interventions/Progress Updates:    Pt resting in bed upon arrival.  Pt engaged in bed mobility tasks including long sitting, modified circle sitting, use of BUE to assist with supine (HOB elevated 50 degrees) to sit.  Pt c/o increase pain in abdomen area with increased upright posture.  Pt transitioned to sitting EOB with S/min A.  Pt declined getting OOB during this session.  Pt returned to supine and remained in bed with all needs within reach.   Therapy Documentation Precautions:  Precautions Precautions: Fall Required Braces or Orthoses: Spinal Brace Spinal Brace: Thoracolumbosacral orthotic, Applied in sitting position Restrictions Weight Bearing Restrictions: No Pain:  Pt c/o abdominal pain around steri strips with movement.  Repositioned See Function Navigator for Current Functional Status.   Therapy/Group: Individual Therapy  [name], Adanna Zuckerman Chappell 02/02/2017, 1:40 PM
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
## 2017-02-02 NOTE — Progress Notes (Signed)
Physical Therapy Session Note  Patient Details  Name: Craig Calderon MRN: [id number] Date of Birth: October 18, 1990  Today's Date: 02/02/2017 PT Individual Time: 1530-1600 PT Individual Time Calculation (min): 30 min   Short Term Goals: Week 1:  PT Short Term Goal 1 (Week 1): Pt will perform bed mobiilty with mod A and use of leg loops.  PT Short Term Goal 2 (Week 1): Pt will demonstrate sitting tolerance for 30 min with no change in vitals.  PT Short Term Goal 3 (Week 1): Pt will transfer from bed<>w/c mod A with slide board.  PT Short Term Goal 4 (Week 1): Pt will verbalize proper pressure relief technique and timing.  PT Short Term Goal 5 (Week 1): Pt will propel manual w/c 75 ft with S.   Skilled Therapeutic Interventions/Progress Updates:   Pt received supine on mat table  With trade off from PT and agreeable additional PT. Supine>sit transfer with +2 assist due to pain assist and moderate cues for UE positioning. PT instructed pt in lateral scoot transfer to [facility] with [place] and min assist from PT. PT required to position BLE for improved safety and stability in transfer as well as provide cues for proper anterior weight shift.  PT instructed pt in WC mobility to weave through 6 cones x 2 min cues for awareness of BLE and prevent hitting feet on wall with turns. Additional WC mobility x 141f with supervision assist from PT with cues for posture and improved force with each push to reduce overall energy use.  SB transfer to bed with min assist from PT and moderate cues for UE placement and improved gluteal clearance from board. Sit>supine completed with max assist for BLE management as well as RUE placement due to poor positioning when moving into sidelying.  Pt left supine in bed with call bell in reach and all needs met.       Therapy Documentation Precautions:  Precautions Precautions: Fall Required Braces or Orthoses: Spinal Brace Spinal Brace: Thoracolumbosacral orthotic, Applied in  sitting position Restrictions Weight Bearing Restrictions: No Pain: Pain Assessment Pain Assessment: 0-10 Pain Score: 7  Pain Location: Abdomen Pain Descriptors / Indicators: Sharp   See Function Navigator for Current Functional Status.   Therapy/Group: Individual Therapy  ALorie Phenix6/09/2016, 4:34 PM

## 2017-02-03 ENCOUNTER — Inpatient Hospital Stay (HOSPITAL_COMMUNITY): Payer: Self-pay | Admitting: Occupational Therapy

## 2017-02-03 ENCOUNTER — Inpatient Hospital Stay (HOSPITAL_COMMUNITY): Payer: Self-pay | Admitting: Physical Therapy

## 2017-02-03 LAB — BASIC METABOLIC PANEL
Anion gap: 9 (ref 5–15)
BUN: 18 mg/dL (ref 6–20)
CHLORIDE: 94 mmol/L — AB (ref 101–111)
CO2: 26 mmol/L (ref 22–32)
Calcium: 9.2 mg/dL (ref 8.9–10.3)
Creatinine, Ser: 1.16 mg/dL (ref 0.61–1.24)
GFR calc Af Amer: >60 mL/min (ref >60)
GFR calc non Af Amer: >60 mL/min (ref >60)
GLUCOSE: 93 mg/dL (ref 65–99)
POTASSIUM: 4.4 mmol/L (ref 3.5–5.1)
Sodium: 129 mmol/L — ABNORMAL LOW (ref 135–145)

## 2017-02-03 LAB — CBC WITH DIFFERENTIAL/PLATELET
BASOS ABS: 0 10*3/uL (ref 0.0–0.1)
Band Neutrophils: 8 %
Basophils Relative: 0 %
Blasts: 0 %
Eosinophils Absolute: 0.2 10*3/uL (ref 0.0–0.7)
Eosinophils Relative: 1 %
HEMATOCRIT: 35.5 % — AB (ref 39.0–52.0)
Hemoglobin: 11.7 g/dL — ABNORMAL LOW (ref 13.0–17.0)
LYMPHS PCT: 9 %
Lymphs Abs: 1.4 10*3/uL (ref 0.7–4.0)
MCH: 29.1 pg (ref 26.0–34.0)
MCHC: 33 g/dL (ref 30.0–36.0)
MCV: 88.3 fL (ref 78.0–100.0)
MYELOCYTES: 0 %
Metamyelocytes Relative: 0 %
Monocytes Absolute: 2.1 10*3/uL — ABNORMAL HIGH (ref 0.1–1.0)
Monocytes Relative: 13 %
NRBC: 0 /100{WBCs}
Neutro Abs: 11.2 10*3/uL — ABNORMAL HIGH (ref 1.7–7.7)
Neutrophils Relative %: 66 %
OTHER: 3 %
PROMYELOCYTES ABS: 0 %
Platelets: 771 10*3/uL — ABNORMAL HIGH (ref 150–400)
RBC: 4.02 MIL/uL — AB (ref 4.22–5.81)
RDW: 14.5 % (ref 11.5–15.5)
WBC: 15.1 10*3/uL — ABNORMAL HIGH (ref 4.0–10.5)

## 2017-02-03 MED ORDER — SODIUM CHLORIDE 0.9 % IV SOLN
INTRAVENOUS | Status: DC
  Administered 2017-02-03: 20:00:00 via INTRAVENOUS

## 2017-02-03 MED ORDER — NORTRIPTYLINE HCL 10 MG PO CAPS
20.0000 mg | ORAL_CAPSULE | Freq: Every day | ORAL | Status: DC
  Administered 2017-02-03 – 2017-02-11 (×9): 20 mg via ORAL
  Filled 2017-02-03 (×9): qty 2

## 2017-02-03 MED ORDER — SODIUM CHLORIDE 0.45 % IV SOLN
INTRAVENOUS | Status: DC
  Administered 2017-02-03: 1 mL via INTRAVENOUS

## 2017-02-03 NOTE — Progress Notes (Signed)
Elevated heart rate noted during afternoon vitals. Patient had just finished therapy. Returned patient to bed and rechecked at rest, still elevated. Elevated temp noted. MD made aware, orders given. IV fluids started. Will report to oncoming RN
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
## 2017-02-03 NOTE — Progress Notes (Signed)
Occupational Therapy Session Note  Patient Details  Name: Craig Calderon P XXXRushing MRN: [id number] Date of Birth: 03/07/1991  Today's Date: 02/03/2017 OT Individual Time: 1352-1415 OT Individual Time Calculation (min): 23 min    Skilled Therapeutic Interventions/Progress Updates: patient stated he was too fatigued to participate.    As well, he complained to tight rightdigits upon extension and pain in both triceps and biceps (from leifting all those weights).    This session he concurred to oparticipate in bilateral upper extremitt stretching to arms and hands.        Therapy Documentation Precautions:  Precautions Precautions: Fall Required Braces or Orthoses: Spinal Brace Spinal Brace: Thoracolumbosacral orthotic, Applied in sitting position Restrictions Weight Bearing Restrictions: No General: General OT Amount of Missed Time: 7 Minutes (sore biceps and triceps and fatigued)  Pain:denied  See Function Navigator for Current Functional Status.   Therapy/Group: Individual Therapy  Bud Faceickett, Jermon Chalfant [facility] 02/03/2017, 9:45 PM

## 2017-02-04 ENCOUNTER — Inpatient Hospital Stay (HOSPITAL_COMMUNITY): Payer: Self-pay | Admitting: Physical Therapy
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
## 2017-02-04 NOTE — Progress Notes (Signed)
Patient agreeable to sitting up to eat dinner. Sat up 30 minutes with meal. Transferred back to bed with cueing.

## 2017-02-04 NOTE — Progress Notes (Signed)
Physical Therapy Session Note  Patient Details  Name: Craig Calderon MRN: [id number] Date of Birth: 06/10/1991  Today's Date: 02/04/2017 PT Individual Time: 1000-1045 PT Individual Time Calculation (min): 45 min   Short Term Goals: Week 1:  PT Short Term Goal 1 (Week 1): Pt will perform bed mobiilty with mod A and use of leg loops.  PT Short Term Goal 2 (Week 1): Pt will demonstrate sitting tolerance for 30 min with no change in vitals.  PT Short Term Goal 3 (Week 1): Pt will transfer from bed<>w/c mod A with slide board.  PT Short Term Goal 4 (Week 1): Pt will verbalize proper pressure relief technique and timing.  PT Short Term Goal 5 (Week 1): Pt will propel manual w/c 75 ft with S.   Skilled Therapeutic Interventions/Progress Updates: Pt received supine in bed, c/o pain as below and reports he had burning sensation all night. Educated pt on neuropathic pain following SCI. Rolling R/L with maxA and bedrails to change brief after small incontinent bowel movement. R sidelying>sit maxA with reduced assistance from BUEs d/t report of pain in L trunk. Transfer bed>w/c and w/c <>mat table with transfer board and minA. Pt assisted with lifting LEs for placement/removal of leg rests; declines performing LE repositioning mid-transfer d/t fear of LOB. Pt declined w/c self-propulsion at this time. Short sit>long sitting on mat table with maxA +2 with pt assisting with repositioning hips and maintaining balance with BUE support. In long sitting instructed pt in use of BUEs to reposition one LE at a time into modified circle sitting; maintained position once achieved to provide LE PROM into external rotation. Long sitting >short sitting with maxA for initiating RLE off side of bed, and pt managing LLE, maxA for balance during activity. Transferred back to w/c as above. Discussed with pt d/c date of 6/22, goals of increasing independence to reduce family burden and avoiding SNF placement. Educated pt in  little/no therapy follow up d/t medicaid pending, high risk of pressure ulcer and moisture associated injury with prolonged static positioning, spending majority of the day in bed on his back, and not initiating or requesting help for pressure relief. Pt with little response to therapist during conversation but does appear to be receptive. Remained seated in w/c at end of session; recommended pt stay up awhile longer before requesting assistance back to bed, pt agreeable.      Therapy Documentation Precautions:  Precautions Precautions: Fall Required Braces or Orthoses: Spinal Brace Spinal Brace: Thoracolumbosacral orthotic, Applied in sitting position Restrictions Weight Bearing Restrictions: No Pain: Pain Assessment Pain Assessment: 0-10 Pain Score: 4  Pain Location: Leg Pain Orientation: Right;Left Pain Descriptors / Indicators: Aching Pain Frequency: Constant Pain Onset: On-going Pain Intervention(s): Medication (See eMAR)   See Function Navigator for Current Functional Status.   Therapy/Group: Individual Therapy  Vista Lawmanlizabeth J Tygielski 02/04/2017, 10:56 AM

## 2017-02-05 ENCOUNTER — Inpatient Hospital Stay (HOSPITAL_COMMUNITY): Payer: Medicaid Other | Admitting: Physical Therapy

## 2017-02-05 ENCOUNTER — Inpatient Hospital Stay (HOSPITAL_COMMUNITY): Payer: Medicaid Other | Admitting: Occupational Therapy

## 2017-02-05 ENCOUNTER — Inpatient Hospital Stay (HOSPITAL_COMMUNITY): Payer: Medicaid Other

## 2017-02-05 ENCOUNTER — Inpatient Hospital Stay (HOSPITAL_COMMUNITY): Payer: Self-pay | Admitting: Occupational Therapy

## 2017-02-05 LAB — BASIC METABOLIC PANEL
Anion gap: 7 (ref 5–15)
BUN: 14 mg/dL (ref 6–20)
CO2: 26 mmol/L (ref 22–32)
CREATININE: 0.86 mg/dL (ref 0.61–1.24)
Calcium: 9 mg/dL (ref 8.9–10.3)
Chloride: 98 mmol/L — ABNORMAL LOW (ref 101–111)
GFR calc Af Amer: >60 mL/min (ref >60)
GFR calc non Af Amer: >60 mL/min (ref >60)
Glucose, Bld: 92 mg/dL (ref 65–99)
Potassium: 4.1 mmol/L (ref 3.5–5.1)
SODIUM: 131 mmol/L — AB (ref 135–145)

## 2017-02-05 LAB — CBC
HCT: 32.3 % — ABNORMAL LOW (ref 39.0–52.0)
Hemoglobin: 10.3 g/dL — ABNORMAL LOW (ref 13.0–17.0)
MCH: 27.9 pg (ref 26.0–34.0)
MCHC: 31.9 g/dL (ref 30.0–36.0)
MCV: 87.5 fL (ref 78.0–100.0)
PLATELETS: 869 10*3/uL — AB (ref 150–400)
RBC: 3.69 MIL/uL — ABNORMAL LOW (ref 4.22–5.81)
RDW: 14.4 % (ref 11.5–15.5)
WBC: 12.9 10*3/uL — AB (ref 4.0–10.5)

## 2017-02-05 LAB — CREATININE, FLUID (PLEURAL, PERITONEAL, JP DRAINAGE): Creat, Fluid: 2 mg/dL

## 2017-02-05 NOTE — Progress Notes (Signed)
Physical Therapy Session Note  Patient Details  Name: [name] P XXXRushing MRN: [id number] Date of Birth: 03/07/1991  Today's Date: 02/05/2017 PT Individual Time: 1430-1530 PT Individual Time Calculation (min): 60 min   Short Term Goals: Week 1:  PT Short Term Goal 1 (Week 1): Pt will perform bed mobiilty with mod A and use of leg loops.  PT Short Term Goal 2 (Week 1): Pt will demonstrate sitting tolerance for 30 min with no change in vitals.  PT Short Term Goal 3 (Week 1): Pt will transfer from bed<>w/c mod A with slide board.  PT Short Term Goal 4 (Week 1): Pt will verbalize proper pressure relief technique and timing.  PT Short Term Goal 5 (Week 1): Pt will propel manual w/c 75 ft with S.   Skilled Therapeutic Interventions/Progress Updates:   Session focused on increasing functional independence with bed mobility and transfers with focus on use of leg loops and techniques for efficiency and balance. Total assist to don pants and leg loops in supine (pt declined attempting circle sitting for donning of pants himself). Pt able to progress rolling to min assist with practice with leg loops (initially mod assist) and cues for technique. During slideboard transfers, focused on patient increasing independence with LE management with leg loops requiring min to mod assist. Dynamic sitting balance from w/c level while playing Wii game using BUE and BLE flat on floor for increased proprioceptive input and decreased lateral support (stayed in w/c due to time constraints but educated pt on progression as mat activity in future session). Pt declined staying up in w/c due to fatigue and transferred back to bed at end of session. Declined positioning with pillows for pressure relief under heels at this time. Educated on use of BLE PRAFO boots and importance of maintaining neutral ankle alignment (PT discussed order with RN for these) in bed and while in w/c. Pt verbalized understanding.   Therapy  Documentation Precautions:  Precautions Precautions: Fall Required Braces or Orthoses: Spinal Brace Spinal Brace: Thoracolumbosacral orthotic, Applied in sitting position Restrictions Weight Bearing Restrictions: No  Pain: Reports ongoing BLE tingling and burning pain - reports he already had medication and "it never goes away"   See Function Navigator for Current Functional Status.   Therapy/Group: Individual Therapy  Karolee StampsGray, Modine Oppenheimer Darrol PokeBrescia  Shatia Sindoni B. Mozell Hardacre, PT, DPT  02/05/2017, 3:43 PM

## 2017-02-05 NOTE — Progress Notes (Signed)
Physical Therapy Session Note  Patient Details  Name: Craig Calderon MRN: [id number] Date of Birth: Jun 01, 1991  Today's Date: 02/05/2017 PT Individual Time: 0915-1015 PT Individual Time Calculation (min): 60 min   Short Term Goals: Week 1:  PT Short Term Goal 1 (Week 1): Pt will perform bed mobiilty with mod A and use of leg loops.  PT Short Term Goal 2 (Week 1): Pt will demonstrate sitting tolerance for 30 min with no change in vitals.  PT Short Term Goal 3 (Week 1): Pt will transfer from bed<>w/c mod A with slide board.  PT Short Term Goal 4 (Week 1): Pt will verbalize proper pressure relief technique and timing.  PT Short Term Goal 5 (Week 1): Pt will propel manual w/c 75 ft with S.   Skilled Therapeutic Interventions/Progress Updates: Pt presented in w/c agreeable to therapy. Propelled approx 28f for UE strengthening and endurance including carpeted surface. Pt required frequent rest breaks due to fatigue. Performed SB transfer to R to mat with min/modA. Pt required frequent cues for increased anterior wt shift and head/hips relationship. Performed sitting balance activities including ball toss, ball taps, and placing horseshoes to basketball hoop. Pt verbalized fear of falling forward when reaching. Discussed goal of therapy to increased endurance and functional mobility and may require at time going outside of "comfort zone". Pt able to perform x8 forward reaches and x6 lateral reaches then indicating increased nausea and fatigue. Pt returned to w/c via SB transfer requiring minA and moderate cues for sequencing. Pt transported back to room due to fatigue and request to return to bed. Pt required modA SB transfer to R w/c to bed with cues for head/hips relationship. Pt returned to bed and positioned with call bell within reach and current needs met.     Therapy Documentation Precautions:  Precautions Precautions: Fall Required Braces or Orthoses: Spinal Brace Spinal Brace:  Thoracolumbosacral orthotic, Applied in sitting position Restrictions Weight Bearing Restrictions: No General:   Vital Signs: Therapy Vitals Pulse Rate: (!) 112 BP: 139/81 Patient Position (if appropriate): Lying Pain: Pain Assessment Pain Assessment: 0-10 Pain Score: 8  Pain Type: Acute pain;Neuropathic pain Pain Location: Leg Pain Orientation: Right;Left Pain Descriptors / Indicators: Aching;Burning Pain Onset: On-going Pain Intervention(s): Medication (See eMAR)  See Function Navigator for Current Functional Status.   Therapy/Group: Individual Therapy  Quiera Diffee  Dorleen Kissel, PTA  02/05/2017, 11:48 AM

## 2017-02-05 NOTE — Progress Notes (Signed)
Orthopedic Tech Progress Note Patient Details:  Craig Calderon 07/02/1991 [id number]  Patient ID: Craig Calderon, male   DOB: 08/30/1991, 26 y.o.   MRN: [id number]   Craig Calderon, Craig Calderon 02/05/2017, 4:24 PM Called in advanced brace order; spoke with Juanda ChanceKanisha

## 2017-02-05 NOTE — Progress Notes (Signed)
Occupational Therapy Session Note  Patient Details  Name: Craig Calderon MRN: [id number] Date of Birth: [birth year]  Today's Date: 02/05/2017 OT Individual Time: 1105-1210 OT Individual Time Calculation (min): 65 min    Short Term Goals: Week 1:  OT Short Term Goal 1 (Week 1): Pt will be able to tolerate sitting on EOB for 10 min to wash UB and don TLSO with close S.  OT Short Term Goal 2 (Week 1): Pt will be able to wash 75% of his lower legs with set up A from bed level. OT Short Term Goal 3 (Week 1): Pt will be able to thread his pants over his feet with adaptive techniques with min A.   OT Short Term Goal 4 (Week 1): Pt will be able to transfer to W/c with sliding board with mod A of 1 to prepare for grooming at the sink.      Skilled Therapeutic Interventions/Progress Updates:    Pt seen this session to work on slide board transfers bed >< w/c and w/c >< wide drop arm BSC placed over toilet. Pt received in bed sleeping and needed a few minutes to wake up fully. Pt sat to EOB with max A. TLSO placed on pt and he worked on trunk control with dynamic reaching just outside of his arm reach.  Pt needed a great deal of encouragement to get started with the activity as he was hesitant to move out of his comfort zone.  He did reach 6x to each side with a slight wt shift.  Touching A with slide board transfers.  In bathroom, 2nd person stood behind him  For safety as there was a large space between edges of seat between w/c and toilet.  Pt transferred well and then worked on lateral leans to work on mobility needed for Probation officer. Pt declined removing pants to see if he needed to toilet.  Pt returned to bed and it was noted his brief was soiled. +2 to adequately roll/ cleanse patient.  Pt adjusted in bed with all needs met.  Therapy Documentation Precautions:  Precautions Precautions: Fall Required Braces or Orthoses: Spinal Brace Spinal Brace: Thoracolumbosacral orthotic, Applied in  sitting position Restrictions Weight Bearing Restrictions: No    Vital Signs: Therapy Vitals Pulse Rate: (!) 112 BP: 139/81 Patient Position (if appropriate): Lying Pain: Pain Assessment Pain Assessment: 0-10 Pain Score: 8  Pain Type: Acute pain;Neuropathic pain Pain Location: Leg Pain Orientation: Right;Left Pain Descriptors / Indicators: Aching;Burning Pain Onset: On-going Pain Intervention(s): Medication (See eMAR) ADL: ADL ADL Comments: refer to functional navigator  See Function Navigator for Current Functional Status.   Therapy/Group: Individual Therapy  [place] 02/05/2017, 12:20 PM

## 2017-02-05 NOTE — Progress Notes (Signed)
Occupational Therapy Session Note  Patient Details  Name: Craig Calderon P XXXRushing MRN: [id number] Date of Birth: 11/19/1990  Today's Date: 02/05/2017 OT Individual Time: 0800-0900 OT Individual Time Calculation (min): 60 min    Short Term Goals: Week 1:  OT Short Term Goal 1 (Week 1): Pt will be able to tolerate sitting on EOB for 10 min to wash UB and don TLSO with close S.  OT Short Term Goal 2 (Week 1): Pt will be able to wash 75% of his lower legs with set up A from bed level. OT Short Term Goal 3 (Week 1): Pt will be able to thread his pants over his feet with adaptive techniques with min A.   OT Short Term Goal 4 (Week 1): Pt will be able to transfer to W/c with sliding board with mod A of 1 to prepare for grooming at the sink.  Skilled Therapeutic Interventions/Progress Updates:    Tx focus on sitting balance, functional transfers, and endurance during completion of functional tasks.   Pt greeted supine in bed at time of arrival. Supine<sit completed with Max A and instruction on technique. Pt requiring bilateral UE support to maintain sitting balance. Teds, gripper socks and TLSO brace donned by therapist. Educated him on technique for donning TLSO. Pt then completed functional slideboard transfer with Min A to w/c. Pt directing care, with mod cues for safe setup of w/c. Pt ate breakfast in w/c. Reported increased bilateral LE pain/burning sensation. Per RN, pt not due for pain medication. Had him listen to meaningful music and educated him on using music for pain mgt. Pt receptive to this, picked out music on phone when eating. Pt then self propelled to sink to complete grooming tasks/oral care with setup. Retrieved bariatric drop arm commode and reviewed transfer technique (to practice during next OT session). Bilateral UE push ups in w/c completed for UB strengthening/pressure relief. Pt able to maintain buttocks clearance for 30 seconds maximum before fatiguing. At end of tx pt left in  standard w/c, pt agreed to wait in this w/c (instead of TIS) for next therapist (in 15 minutes). All needs within reach.   Therapy Documentation Precautions:  Precautions Precautions: Fall Required Braces or Orthoses: Spinal Brace Spinal Brace: Thoracolumbosacral orthotic, Applied in sitting position Restrictions Weight Bearing Restrictions: No   Vital Signs: Therapy Vitals Pulse Rate: (!) 112 BP: 139/81 Patient Position (if appropriate): Lying Pain: Pain Assessment Pain Assessment: 0-10 Pain Score: 8  Pain Type: Acute pain;Neuropathic pain Pain Location: Leg Pain Orientation: Right;Left Pain Descriptors / Indicators: Aching;Burning Pain Onset: On-going Pain Intervention(s): Medication (See eMAR) ADL: ADL ADL Comments: refer to functional navigator    See Function Navigator for Current Functional Status.   Therapy/Group: Individual Therapy  Fitzhugh Vizcarrondo A Bassam Dresch 02/05/2017, 12:22 PM
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
## 2017-02-06 ENCOUNTER — Inpatient Hospital Stay (HOSPITAL_COMMUNITY): Payer: Self-pay | Admitting: Physical Therapy

## 2017-02-06 ENCOUNTER — Inpatient Hospital Stay (HOSPITAL_COMMUNITY): Payer: Self-pay | Admitting: Occupational Therapy

## 2017-02-06 ENCOUNTER — Inpatient Hospital Stay (HOSPITAL_COMMUNITY): Payer: Self-pay | Admitting: *Deleted

## 2017-02-06 DIAGNOSIS — M792 Neuralgia and neuritis, unspecified: Secondary | ICD-10-CM

## 2017-02-06 MED ORDER — GABAPENTIN 100 MG PO CAPS
100.0000 mg | ORAL_CAPSULE | Freq: Three times a day (TID) | ORAL | Status: DC
  Administered 2017-02-06 – 2017-02-08 (×7): 100 mg via ORAL
  Filled 2017-02-06 (×7): qty 1

## 2017-02-06 MED ORDER — PREGABALIN 75 MG PO CAPS
75.0000 mg | ORAL_CAPSULE | Freq: Every day | ORAL | Status: DC
  Administered 2017-02-07 – 2017-02-08 (×2): 75 mg via ORAL
  Filled 2017-02-06 (×2): qty 1

## 2017-02-06 NOTE — Progress Notes (Addendum)
Physical Therapy Session Note  Patient Details  Name: [name] P XXXRushing MRN: [id number] Date of Birth: 03/03/1991  Today's Date: 02/06/2017 PT Individual Time: 1300-1330 PT Individual Time Calculation (min): 30 min    Skilled Therapeutic Interventions/Progress Updates: Pt received semi-reclined in bed, c/o leg pain pre-medicated. Semi-reclined>sit on EOB with minA using leg loops and increasing speed and initiation to task. Transfer bed>w/c and w/c >mat table with minA, therapist providing setupA for transfer board and pt directing care for board placement, w/c setup and min cues for technique. Pt declined w/c propulsion despite max encouragement. Pt improving with participation in session, talking more with therapist and verbalizing assist needed. Remained seated on edge of mat table with handoff to OT for next session.      Therapy Documentation Precautions:  Precautions Precautions: Fall Required Braces or Orthoses: Spinal Brace Spinal Brace: Thoracolumbosacral orthotic, Applied in sitting position Restrictions Weight Bearing Restrictions: No Pain: Pain Assessment Faces Pain Scale: Hurts worst  See Function Navigator for Current Functional Status.   Therapy/Group: Individual Therapy  Vista Lawmanlizabeth J Tygielski 02/06/2017, 2:19 PM

## 2017-02-06 NOTE — Progress Notes (Signed)
Occupational Therapy Session Note  Patient Details  Name: Craig Calderon MRN: [id number] Date of Birth: 12/07/1990  Today's Date: 02/06/2017 OT Individual Time: 0900-1000 OT Individual Time Calculation (min): 60 min    Short Term Goals: Week 1:  OT Short Term Goal 1 (Week 1): Pt will be able to tolerate sitting on EOB for 10 min to wash UB and don TLSO with close S.  OT Short Term Goal 2 (Week 1): Pt will be able to wash 75% of his lower legs with set up A from bed level. OT Short Term Goal 3 (Week 1): Pt will be able to thread his pants over his feet with adaptive techniques with min A.   OT Short Term Goal 4 (Week 1): Pt will be able to transfer to W/c with sliding board with mod A of 1 to prepare for grooming at the sink.  Skilled Therapeutic Interventions/Progress Updates:    Pt resting in w/c upon arrival.  Pt completed grooming tasks prior to propelling to therapy gym.  Initial focus on SBT to mat.  Pt attempted transfer X 3 but brief continued to "catch" on sliding board and pt unable to provide adequate lift to clean edge of sliding board.  Pt returned to room and transferred to bed to change brief and position properly to faciliate transfers.  Pt required mod A for SBT to bed 2/2 difficulty with sliding board snagging on clothing.  Pt also wearing paper pants which complicated transfers 2/2 pants tearing.  Pt required min A for rolling in bed to facilitate changing of brief.  New paper scrub pants provided 2/2 pt does not have any clean clothing.  Pt stated he was going to have a family member bring in clean clothing.  Pt remained in bed with RN present.    Therapy Documentation Precautions:  Precautions Precautions: Fall Required Braces or Orthoses: Spinal Brace Spinal Brace: Thoracolumbosacral orthotic, Applied in sitting position Restrictions Weight Bearing Restrictions: No Pain: Pain Assessment Faces Pain Scale: Hurts whole lot (will not tell me number or how bad it  hurts) Pain Type: Acute pain Pain Location: Leg Pain Orientation: Right;Left Pain Intervention(s): RN aware and repositioned  See Function Navigator for Current Functional Status.   Therapy/Group: Individual Therapy  [name], [name] 02/06/2017, 11:01 AM

## 2017-02-06 NOTE — Progress Notes (Signed)
Orthopedic Tech Progress Note Patient Details:  [name] 08/17/1991 [id number] Brace completed by [employer]. Patient ID: [name], male   DOB: 07/22/1991, 26 y.o.   MRN: [id number]   [name], [name] 02/06/2017, 6:40 PM
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
## 2017-02-06 NOTE — Progress Notes (Signed)
Occupational Therapy Session Note  Patient Details  Name: Craig Calderon MRN: [id number] Date of Birth: 03/31/1991  Today's Date: 02/06/2017 OT Individual Time: 1137-1209 and 1330-1430 OT Individual Time Calculation (min): 32 min and 60 min   Short Term Goals: Week 1:  OT Short Term Goal 1 (Week 1): Pt will be able to tolerate sitting on EOB for 10 min to wash UB and don TLSO with close S.  OT Short Term Goal 2 (Week 1): Pt will be able to wash 75% of his lower legs with set up A from bed level. OT Short Term Goal 3 (Week 1): Pt will be able to thread his pants over his feet with adaptive techniques with min A.   OT Short Term Goal 4 (Week 1): Pt will be able to transfer to W/c with sliding board with mod A of 1 to prepare for grooming at the sink.  Skilled Therapeutic Interventions/Progress Updates:    1) Treatment session with focus on BUE strengthening and stretching to minimize pain from w/c propulsion.  Provided pt with STOMPS (Strengthening and Optimal Movements for Painful Shoulders) handout.  Completed stretching and active movement to maintain shoulder integrity due to repetitive forward motion with w/c propulsion.  Completed 3 reps of each stretch with hold of 15 seconds with min cues for technique and intermittent tactile cues to maintain positioning.  Pt requested to transfer back to bed at end of session.  Therapist placed slide board with cues for pt to utilize leg loops to assist with positioning.  Min assist during transfer and assist to lift BLE into bed.  Left semi-reclined with all needs in reach.  2) Treatment session with focus on sitting balance and BUE strengthening.  Pt received seated on edge of mat post transfer with PT.  Challenged sitting balance while utilizing punching bag for alternating UE support.  Min assist for sitting balance during alternating punches due to moments without UE support.  Pt with improved motivation and participation as punching bag activity  continued.  Monitored BP at 1355 with 132/76 and HR 122.  Completed transfer into w/c with slide board with assist to place board while pt managing legs with leg loops.  Continued to educate pt on STOMPS exercises with pt completing 2 reps abduction of 15 with BUE with 5# dumb bell and 2 reps internal/external rotation of 10 with theraband.  Returned to bed at end of session via slide board with min assist and setup for placement of slide board.  Therapy Documentation Precautions:  Precautions Precautions: Fall Required Braces or Orthoses: Spinal Brace Spinal Brace: Thoracolumbosacral orthotic, Applied in sitting position Restrictions Weight Bearing Restrictions: No Pain: Pain Assessment Faces Pain Scale: Hurts worst Pain Type: Acute pain Pain Location: Leg Pain Orientation: Right;Left Pain Intervention(s): Premedicated  See Function Navigator for Current Functional Status.   Therapy/Group: Individual Therapy  [name], [name] 02/06/2017, 12:26 PM

## 2017-02-06 NOTE — Progress Notes (Signed)
Physical Therapy Weekly Progress Note  Patient Details  Name: Craig Calderon MRN: [id number] Date of Birth: 06-20-1991  Beginning of progress report period: Jan 30, 2017 End of progress report period: February 06, 2017  Today's Date: 02/06/2017 PT Individual Time: 1000-1100 PT Individual Time Calculation (min): 60 min   Patient has met 4 of 5 short term goals.  Pt has improved slide board transfers to min A with moderate verbal cuing for sequencing and management of LEs. Pt mod A for bed mobility using leg loops with cues for sequencing and management of LEs using leg leg loops. Pt educated on pressure relief technique but needs reinforcement technique and timing. Pt able to propel manual w/c with S but lacks motivation during therapy sessions and declines propelling w/c much of the time. Pt requires continued practice with OOB tolerance, transfers, bed mobility, and pressure relief to progress toward LTGs. Continued practice directing pt's care with minimal cuing during transfers, bed mobility, and demonstrating pressure relief.  Patient continues to demonstrate the following deficits muscle weakness and muscle paralysis, decreased cardiorespiratoy endurance, impaired timing and sequencing, abnormal tone and decreased motor planning and decreased sitting balance, decreased postural control and decreased balance strategies and therefore will continue to benefit from skilled PT intervention to increase functional independence with mobility.  Patient progressing toward long term goals..  Continue plan of care.  PT Short Term Goals Week 1:  PT Short Term Goal 1 (Week 1): Pt will perform bed mobiilty with mod A and use of leg loops.  PT Short Term Goal 1 - Progress (Week 1): Met PT Short Term Goal 2 (Week 1): Pt will demonstrate sitting tolerance for 30 min with no change in vitals.  PT Short Term Goal 2 - Progress (Week 1): Met PT Short Term Goal 3 (Week 1): Pt will transfer from bed<>w/c mod A with  slide board.  PT Short Term Goal 3 - Progress (Week 1): Met PT Short Term Goal 4 (Week 1): Pt will verbalize proper pressure relief technique and timing.  PT Short Term Goal 4 - Progress (Week 1): Progressing toward goal PT Short Term Goal 5 (Week 1): Pt will propel manual w/c 75 ft with S.  PT Short Term Goal 5 - Progress (Week 1): Met Week 2:  PT Short Term Goal 1 (Week 2): Pt will verbalize proper pressure relief technique and timing.  PT Short Term Goal 2 (Week 2): Pt will transfer from bed<>w/c S with minimal cuing.  PT Short Term Goal 3 (Week 2): Pt will perform bed mobility with min A using leg loops.  PT Short Term Goal 4 (Week 2): Pt will demonstrate sitting tolerance in w/c for 2 hours with appropriate pressure relief. PT Short Term Goal 5 (Week 2): Pt will propel manual w/c 121f with S.   Skilled Therapeutic Interventions/Progress Updates:  Pt received supine in bed. TED hose and leg loops donned. Pt reports having pain in his legs today. Pt mod A for supine>sit on EOB with verbal cues for management of LEs using leg loops. Min A slide board transfer for management of slide board from bed>w/c with pt directing transfer. Pt required questioning cues in order to verbalize sequencing of transfer and assistance required. Encouraged pt to propel manual w/c but pt declined. Min A slide board transfer from w/c>mat. PT demonstrated different techniques for sit>supine using leg loops to increase pt independence with bed mobility. Pt min A for sit<>supine on mat table for LE management; given moderate  verbal cuing for technique and use of leg loops for assistance with LEs. Pt required more than normal amount of time for sit<>supine but required slightly less assistance and cuing for supine>sit compared to sit>supine. Min A slide board transfer from mat>w/c for slide board management and cues for sequencing. Pt left seated in manual w/c in room with all needs met.   Therapy  Documentation Precautions:  Precautions Precautions: Fall Required Braces or Orthoses: Spinal Brace Spinal Brace: Thoracolumbosacral orthotic, Applied in sitting position Restrictions Weight Bearing Restrictions: No   See Function Navigator for Current Functional Status.  Therapy/Group: Individual Therapy  Alysia Penna 02/06/2017, 3:19 PM

## 2017-02-07 ENCOUNTER — Inpatient Hospital Stay (HOSPITAL_COMMUNITY): Payer: Self-pay

## 2017-02-07 ENCOUNTER — Inpatient Hospital Stay (HOSPITAL_COMMUNITY): Payer: Self-pay | Admitting: Physical Therapy

## 2017-02-07 ENCOUNTER — Inpatient Hospital Stay (HOSPITAL_COMMUNITY): Payer: Medicaid Other | Admitting: *Deleted

## 2017-02-07 ENCOUNTER — Inpatient Hospital Stay (HOSPITAL_COMMUNITY): Payer: Self-pay | Admitting: Occupational Therapy

## 2017-02-07 NOTE — Progress Notes (Signed)
Occupational Therapy Note  Patient Details  Name: Craig Calderon MRN: [id number] Date of Birth: 10/24/1990  Today's Date: 02/07/2017 OT Individual Time: 1400-1425 OT Individual Time Calculation (min): 25 min   Pt with no c/o pain Individual therapy (cotx with Recreational Therapy)  Pt resting in w/c upon arrival and requested to return to bed.  Pt positioned w/c appropriately for transfer and directed therapist with assistance required.  Pt performed slide board transfer with steady A.  Pt removed slide board while seated EOB.  Pt requested assistance with lifting BLE onto bed.  Pt able to reposition in bed without assistance.  Continued ongoing discharge planning, POC, and therapy schedule.  Pt remains with flat affect and delayed responses to questions.  Pt remained in bed with all needs within reach.  Lavone NeriLanier, Jonah Nestle [facility] ClinicsChappell 02/07/2017, 2:38 PM
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
## 2017-02-07 NOTE — Progress Notes (Signed)
Physical Therapy Session Note  Patient Details  Name: Craig Calderon MRN: [id number] Date of Birth: 08/27/1991  Today's Date: 02/07/2017 PT Individual Time: [id number] PT Individual Time Calculation (min): 31 min   Short Term Goals: Week 2:  PT Short Term Goal 1 (Week 2): Pt will verbalize proper pressure relief technique and timing.  PT Short Term Goal 2 (Week 2): Pt will transfer from bed<>w/c S with minimal cuing.  PT Short Term Goal 3 (Week 2): Pt will perform bed mobility with min A using leg loops.  PT Short Term Goal 4 (Week 2): Pt will demonstrate sitting tolerance in w/c for 2 hours with appropriate pressure relief. PT Short Term Goal 5 (Week 2): Pt will propel manual w/c 16950ft with S.   Skilled Therapeutic Interventions/Progress Updates:    Pt in bed upon arrival, agreeable to session with encouragement. Pt willing to sit EOB. Total assist donning leg loops. Supine>sitting with min assist and sitting>supine with mod assist. While sitting EOB, working on trunk control with double>single>no UE support. Adding unilateral UE resistance to challenge trunk musculature and balance as well. Pt with flat affect throughout session. Following session, pt returned to supine, all needs in reach.   Therapy Documentation Precautions:  Precautions Precautions: Fall Required Braces or Orthoses: Spinal Brace Spinal Brace: Thoracolumbosacral orthotic, Applied in sitting position Restrictions Weight Bearing Restrictions: No Pain:  10/10 pain in LEs, reports having meds already, monitored during session.   See Function Navigator for Current Functional Status.   Therapy/Group: Individual Therapy  Delton SeeBenjamin Punam Broussard, PT 02/07/2017, 5:19 PM
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
## 2017-02-07 NOTE — Progress Notes (Signed)
Occupational Therapy Session Note  Patient Details  Name: Craig Calderon MRN: [id number] Date of Birth: 07/18/1991  Today's Date: 02/07/2017 OT Individual Time: 0900-1000 OT Individual Time Calculation (min): 60 min    Short Term Goals: Week 2:  OT Short Term Goal 1 (Week 2): Pt will doff pants seated EOB/BSC with mod A for balance while utilizing lateral leans OT Short Term Goal 2 (Week 2): Pt will perform slideboard transfers to drop arm BSC with min A OT Short Term Goal 3 (Week 2): Pt will perform slideboard tub bench transfers with mod A OT Short Term Goal 4 (Week 2): Pt will perform LB dressing tasks with min a at bed level  Skilled Therapeutic Interventions/Progress Updates:    Pt resting in bed upon arrival.  Focus on bed mobility, functional transfers, UB dressing, and dynamic sitting balance.  Pt required min A to place BLE over edge of bed and min A for supine>sit EOB.  Pt maintained sitting balance and performed minimal lateral lean to allow for positioning of slide board.  Pt performed slide board transfer with steady A.  Pt propelled to therapy gym and transferred onto mat.  Attempted to initiate dynamic sitting tasks with emphasis on lateral leans for LB clothing management on BSC.  Pt declined practicing lateral leans stating that he would be able to "do all that at home with no problem."  Encouraged pt to attempt lateral lean but pt continued to resist.  Engaged pt in sitting balance tasks including reaching for objects outside BOS and using contralateral UE for support.  Pt receptive to activities and continued to engage when objects placed an further distance and thus challenging balance.  Pt transferred back to w/c and returned to bed (pt's request) at end of session.  All needs within reach.   Therapy Documentation Precautions:  Precautions Precautions: Fall Required Braces or Orthoses: Spinal Brace Spinal Brace: Thoracolumbosacral orthotic, Applied in sitting  position Restrictions Weight Bearing Restrictions: No  Pain: Pain Assessment Pain Assessment: 0-10 Pain Score: 10-Worst pain ever Pain Type: Acute pain Pain Location: Leg Pain Orientation: Right;Left Pain Descriptors / Indicators: Burning Pain Frequency: Constant Pain Onset: On-going Patients Stated Pain Goal: 3 Pain Intervention(s): RN aware and repositioned/activity  See Function Navigator for Current Functional Status.   Therapy/Group: Individual Therapy  [name], [name] 02/07/2017, 12:13 PM

## 2017-02-07 NOTE — Progress Notes (Signed)
Occupational Therapy Weekly Progress Note  Patient Details  Name: Craig Calderon MRN: [id number] Date of Birth: 1991/05/13  Beginning of progress report period: Jan 30, 2017 End of progress report period: February 07, 2017  Patient has met 4 of 4 short term goals.  Pt made steady progress with STGs this past week.  Pt completes UB bathing/dressing tasks at supervision level, requiring assistance with donning TLSO.  Pt has demonstrated ability to don socks and thread his pants while seated in modified circle sitting in bed with min A for sitting balance.  Pt requires mod A for slideboard transfers to drop arm BSC and min A for slideboard transfers to w/c.  Pt has been issued leg loops and training is ongoing.  Pt requires min verbal cues for directing care.  Pt requires mod encouragement to participate in therapy sessions.   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  Patient progressing toward long term goals..  Continue plan of care.  OT Short Term Goals Week 1:  OT Short Term Goal 1 (Week 1): Pt will be able to tolerate sitting on EOB for 10 min to wash UB and don TLSO with close S.  OT Short Term Goal 1 - Progress (Week 1): Met OT Short Term Goal 2 (Week 1): Pt will be able to wash 75% of his lower legs with set up A from bed level. OT Short Term Goal 2 - Progress (Week 1): Met OT Short Term Goal 3 (Week 1): Pt will be able to thread his pants over his feet with adaptive techniques with min A.   OT Short Term Goal 3 - Progress (Week 1): Met OT Short Term Goal 4 (Week 1): Pt will be able to transfer to W/c with sliding board with mod A of 1 to prepare for grooming at the sink. OT Short Term Goal 4 - Progress (Week 1): Met Week 2:  OT Short Term Goal 1 (Week 2): Pt will doff pants seated EOB/BSC with mod A for balance  while utilizing lateral leans OT Short Term Goal 2 (Week 2): Pt will perform slideboard transfers to drop arm BSC with min A OT Short Term Goal 3 (Week 2): Pt will perform slideboard tub bench transfers with mod A OT Short Term Goal 4 (Week 2): Pt will perform LB dressing tasks with min a at bed level  Therapy Documentation Precautions:  Precautions Precautions: Fall Required Braces or Orthoses: Spinal Brace Spinal Brace: Thoracolumbosacral orthotic, Applied in sitting position Restrictions Weight Bearing Restrictions: No  See Function Navigator for Current Functional Status.    Leotis [facility] 02/07/2017, 6:45 AM

## 2017-02-07 NOTE — Plan of Care (Signed)
Problem: SCI BOWEL ELIMINATION Goal: RH STG SCI MANAGE BOWEL WITH MEDICATION WITH ASSISTANCE STG SCI Manage bowel with medication with assistance. Mod  Outcome: Not Progressing Patient refusing am bowel program Goal: RH STG SCI MANAGE BOWEL PROGRAM W/ASSIST OR AS APPROPRIATE STG SCI Manage bowel program w/assist or as appropriate.  Mod  Outcome: Not Progressing refusing

## 2017-02-07 NOTE — Progress Notes (Signed)
Physical Therapy Session Note  Patient Details  Name: Craig Calderon MRN: [id number] Date of Birth: 03-06-1991  Today's Date: 02/07/2017 PT Individual Time: 1100-1130 and 1300-1400 PT Individual Time Calculation (min): 30 min and 60 min (total 90 min)   Short Term Goals: Week 2:  PT Short Term Goal 1 (Week 2): Pt will verbalize proper pressure relief technique and timing.  PT Short Term Goal 2 (Week 2): Pt will transfer from bed<>w/c S with minimal cuing.  PT Short Term Goal 3 (Week 2): Pt will perform bed mobility with min A using leg loops.  PT Short Term Goal 4 (Week 2): Pt will demonstrate sitting tolerance in w/c for 2 hours with appropriate pressure relief. PT Short Term Goal 5 (Week 2): Pt will propel manual w/c 151ft with S.   Skilled Therapeutic Interventions/Progress Updates: Tx 1: Pt received supine in bed, c/o neuropathic pain in BLEs as described below. Supine>sit with leg loops, HOB elevated and bedrails with min guard. Transfer bed>w/c with transfer board and min guard, assist for placing board, with pt directing care and assist needed. Pt repositioning legs during transfer without cueing. Educated pt in role of anti-tippers to prevent posterior LOB however limiting ability to perform wheelies for propulsion over thresholds and curbs. Pt attempts wheelie several times, however fearful of posterior LOB. Held pt in backwards tipped position to increase comfort and familiarity with position; pt reports he isn't ready to do that on his own and refuses further attempts. Agreeable to w/c propulsion, x150' total with BUE and S. Returned to bed with min guard lateral scoot transfer. Remained supine at end of session, all needs in reach.   Tx 2: Pt received supine in bed, c/o pain as below and agreeable to treatment. Discussed plan to trial bed mobility in flat bed in ADL apartment as pt has reported he did not want to have a hospital bed at home, although he reports his mother is  encouraging him to get one as it will be easier on her when she needs to assist. Discussed functional goal level based on level of injury and hope that he does not need physical assist for bed mobility and transfers. Pt reports understanding but does not want to practice a regular bed due to redundancy of getting out of bed in his room "just to get in another bed and have to get out of that one to get back in this one"; therapist selecting to not pressure pt into activity to increase willingness to participate in other therapeutic interventions. Bed mobility performed with close S using leg loops, HOB elevated and bedrails; transferred to chair with close S and setup for board placement. Pt able to complete bed mobility and transfer in under 5 min; activity which previously required up to 30 min to complete. W/c propulsion to gym with BUE and S. Transfer w/c <>mat table with S, setup for board placement. Short sit >long sit with BUE support and maxA for BLE management for time/energy conservation. Long sitting BUE propped>upright sitting with BUEs on leg loops to find neutral sitting balance. Pt able to doff BLE ankle leg loops and socks with min guard for sitting balance. Long sit>short sit with leg loops and min guard. Transfer back to chair with min guard. Remained seated in w/c at end of session with handoff to OT for next session, all needs in reach.      Therapy Documentation Precautions:  Precautions Precautions: Fall Required Braces or Orthoses: Spinal Brace Spinal  Brace: Thoracolumbosacral orthotic, Applied in sitting position Restrictions Weight Bearing Restrictions: No Pain: Pain Assessment Pain Assessment: 0-10 Pain Score: 8  Pain Type: Acute pain Pain Location: Leg Pain Orientation: Right;Left Pain Descriptors / Indicators: Burning Pain Frequency: Constant Pain Onset: On-going Patients Stated Pain Goal: 3 Pain Intervention(s): Medication (See eMAR)   See Function Navigator for  Current Functional Status.   Therapy/Group: Individual Therapy  Vista Lawmanlizabeth J Tygielski 02/07/2017, 1:56 PM

## 2017-02-07 NOTE — Progress Notes (Signed)
Occupational Therapy Session Note  Patient Details  Name: [name] P XXXRushing MRN: [id number] Date of Birth: 03/19/1991  Today's Date: 02/07/2017 OT Individual Time: 0730-0800 OT Individual Time Calculation (min): 30 min    Short Term Goals: Week 2:  OT Short Term Goal 1 (Week 2): Pt will doff pants seated EOB/BSC with mod A for balance while utilizing lateral leans OT Short Term Goal 2 (Week 2): Pt will perform slideboard transfers to drop arm BSC with min A OT Short Term Goal 3 (Week 2): Pt will perform slideboard tub bench transfers with mod A OT Short Term Goal 4 (Week 2): Pt will perform LB dressing tasks with min a at bed level  Skilled Therapeutic Interventions/Progress Updates:    Pt seen for OT session focusing on ADL re-training with emphasis on LE management. Pt in supine upon arrival, required increased time and encouragement for participation and remained reserved with limited interaction throughout session.  LB bathing completed from supine position with min A and VCs for technique to manage LEs into figure four position for pt to access and wash LEs. Following set-up assist of LEs, pt able to use UEs to maintain position and wash feet/ legs. Pt unable to direct caregiver with sequencing of doffing/donning pants or bed mobility. Attempted to educate regarding importance of directing care, however, pt quickly becoming annoyed with therapist and therefore stopped education in order to maintain rapport with pt. He rolled with mod A for buttock hygiene to be completed and new brief donned. Pt left in supine at end of session with NT present to assist with finishing dressing.   Therapy Documentation Precautions:  Precautions Precautions: Fall Required Braces or Orthoses: Spinal Brace Spinal Brace: Thoracolumbosacral orthotic, Applied in sitting position Restrictions Weight Bearing Restrictions: No Pain: Complaints of nerve pain in B LEs, reports being pre-medicated   ADL: ADL ADL Comments: refer to functional navigator  See Function Navigator for Current Functional Status.   Therapy/Group: Individual Therapy  Lewis, Shannan Slinker C 02/07/2017, 7:05 AM

## 2017-02-07 NOTE — Plan of Care (Signed)
Problem: SCI BOWEL ELIMINATION Goal: RH STG MANAGE BOWEL WITH ASSISTANCE STG Manage Bowel with Assistance. Mod  Outcome: Not Progressing Patient refusing bowel program of suppository intermittently Goal: RH STG SCI MANAGE BOWEL WITH MEDICATION WITH ASSISTANCE STG SCI Manage bowel with medication with assistance. Mod  Outcome: Not Progressing Refusing suppository Goal: RH STG SCI MANAGE BOWEL PROGRAM W/ASSIST OR AS APPROPRIATE STG SCI Manage bowel program w/assist or as appropriate.  Mod  Outcome: Not Progressing Refusing bowel program intermittently  Problem: RH PAIN MANAGEMENT Goal: RH STG PAIN MANAGED AT OR BELOW PT'S PAIN GOAL Lower than 5  Outcome: Not Progressing Constant burning pain to bilateral legs

## 2017-02-07 NOTE — Progress Notes (Signed)
Social Work Patient ID: Craig Calderon, male   DOB: 11-01-1990, 26 y.o.   MRN: [id number]   CSW met with pt to update him on team conference discussion.  Told him no changed was made to d/c date and he asked what it was again.  CSW told him 02-23-17 and pt seemed discouraged as he really wants to go home.  CSW explained we want to get him as strong as we can to give him the most independence and he seemed to understand that.  CSW offered to give him something to help pass the time faster and he stated that he had his phone and that is all he needed.  CSW will continue to check in with pt and assist him as needed.

## 2017-02-08 ENCOUNTER — Inpatient Hospital Stay (HOSPITAL_COMMUNITY): Payer: Self-pay

## 2017-02-08 ENCOUNTER — Inpatient Hospital Stay (HOSPITAL_COMMUNITY): Payer: Self-pay | Admitting: Physical Therapy

## 2017-02-08 LAB — CULTURE, BLOOD (SINGLE)
Culture: NO GROWTH
SPECIAL REQUESTS: ADEQUATE

## 2017-02-08 MED ORDER — GABAPENTIN 100 MG PO CAPS
200.0000 mg | ORAL_CAPSULE | Freq: Three times a day (TID) | ORAL | Status: DC
  Administered 2017-02-08 – 2017-02-09 (×3): 200 mg via ORAL
  Filled 2017-02-08 (×3): qty 2

## 2017-02-08 MED ORDER — SENNA 8.6 MG PO TABS
2.0000 | ORAL_TABLET | Freq: Two times a day (BID) | ORAL | Status: DC
  Administered 2017-02-08 – 2017-02-22 (×27): 17.2 mg via ORAL
  Filled 2017-02-08 (×29): qty 2

## 2017-02-08 NOTE — Progress Notes (Signed)
Physical Therapy Session Note  Patient Details  Name: Craig Calderon P XXXRushing MRN: [id number] Date of Birth: 02/15/1991  Today's Date: 02/08/2017 PT Individual Time: 0900-1000 PT Individual Time Calculation (min): 60 min   Short Term Goals: Week 2:  PT Short Term Goal 1 (Week 2): Pt will verbalize proper pressure relief technique and timing.  PT Short Term Goal 2 (Week 2): Pt will transfer from bed<>w/c S with minimal cuing.  PT Short Term Goal 3 (Week 2): Pt will perform bed mobility with min A using leg loops.  PT Short Term Goal 4 (Week 2): Pt will demonstrate sitting tolerance in w/c for 2 hours with appropriate pressure relief. PT Short Term Goal 5 (Week 2): Pt will propel manual w/c [id number] with S.   Skilled Therapeutic Interventions/Progress Updates: Pt received supine in bed, c/o BLE neuropathic pain medicated at beginning of session. Rolling R/L modA with bedrails to change brief and doff pants totalA. Pt given shorts and instructed to put them on himself; pt became frustrated stating he did not want to and that today "wasn't his day". Discussed functional goal level with pt based on level of injury and relatively low complexity SCI, that pt should be able to dress himself and get in/out of bed without assist, and that if he requested assist it was because he did not want to put the effort in to learn or initiate himself and not because his injury would not allow him to do those things himself. Pt ultimately able to don shorts with minA for pulling pants over hips which he refused to attempt once rolled to sidelying. Elevated HOB and use of leg loops to allow pt to reach legs and thread shorts without assist, does require increased time d/t slow initiation and problem solving for technique. Supine>sit minA with leg loops, HOB elevated and bedrails. Transfer bed>w/c min guard with transfer board and pt directing care and assist needed. Once seated in w/c, pt educated in pressure relief techniques,  provided handout for various techniques including lateral leans, forward lean, w/c pushup and boosting. Discussed importance of pressure relief every 30 min while in chair to reduce risk of skin breakdown and infection. Pt does not appear to be overly receptive to education. Remained seated in w/c at end of session with all needs in reach; encouraged pt to remain up until next OT session in 30 min.      Therapy Documentation Precautions:  Precautions Precautions: Fall Required Braces or Orthoses: Spinal Brace Spinal Brace: Thoracolumbosacral orthotic, Applied in sitting position Restrictions Weight Bearing Restrictions: No   See Function Navigator for Current Functional Status.   Therapy/Group: Individual Therapy  Vista Lawmanlizabeth J Tygielski 02/08/2017, 4:08 PM

## 2017-02-08 NOTE — Plan of Care (Signed)
Problem: SCI BOWEL ELIMINATION Goal: RH STG MANAGE BOWEL WITH ASSISTANCE STG Manage Bowel with Assistance. Mod  Outcome: Not Progressing LBM 02-05-17 refused supporsitory

## 2017-02-08 NOTE — Progress Notes (Signed)
Physical Therapy Session Note  Patient Details  Name: Craig Calderon MRN: [id number] Date of Birth: 05/26/1991  Today's Date: 02/08/2017 PT Individual Time: 1300-1340 PT Individual Time Calculation (min): 40 min   Skilled Therapeutic Interventions/Progress Updates:    Pt presents in bed reporting it has been a bad day and c/o pain in BLE as cramping and pins/needles. RN already aware. Pt declining OOB at this time due to this. Provided significant education on importance of mobility, weightbearing, and increasing OOB tolerance in attempt to encourage patient to participate OOB but continues to decline. Continued education provided on discharge planning including assistance at home, goals, bowel/bladder function, equipment, and resources available. Provided patient with link and information for access to [facility] personal care guide through iTunes which has information for him and family in regards to SCI. Pt agreeable to allow PT to put PRAFO's on for positioning in bed. Pt declined further therapy at this time.   Therapy Documentation Precautions:  Precautions Precautions: Fall Required Braces or Orthoses: Spinal Brace Spinal Brace: Thoracolumbosacral orthotic, Applied in sitting position Restrictions Weight Bearing Restrictions: No General: PT Amount of Missed Time (min): 20 Minutes PT Missed Treatment Reason: Patient unwilling to participate;Pain   Pain: Reports BLE pain 8/10 - already received medication and RN aware that no improvement has been made   See Function Navigator for Current Functional Status.   Therapy/Group: Individual Therapy  Karolee StampsGray, Alastair Hennes Darrol PokeBrescia  Myeisha Kruser B. Alveria Mcglaughlin, PT, DPT  02/08/2017, 1:51 PM

## 2017-02-08 NOTE — Progress Notes (Signed)
Occupational Therapy Session Note  Patient Details  Name: Craig Calderon MRN: [id number] Date of Birth: 11/29/1990  Today's Date: 02/08/2017 OT Individual Time: 4098-11911030-1125 OT Individual Time Calculation (min): 55 min    Short Term Goals: Week 2:  OT Short Term Goal 1 (Week 2): Pt will doff pants seated EOB/BSC with mod A for balance while utilizing lateral leans OT Short Term Goal 2 (Week 2): Pt will perform slideboard transfers to drop arm BSC with min A OT Short Term Goal 3 (Week 2): Pt will perform slideboard tub bench transfers with mod A OT Short Term Goal 4 (Week 2): Pt will perform LB dressing tasks with min a at bed level  Skilled Therapeutic Interventions/Progress Updates:    Pt resting in w/c upon arrival.  Pt continues to demonstrate delay in responding to greeting and conversation/questioning.  PT refused to propel w/c to therapy gym.  When questioned if he was tired or in pain, pt stated, "I dont' feel like it." When questioned on what he would do at home, pt stated that he wasn't at home now but declined to provided further details.  Pt transitioned to gym and engaged in BUE therex with focus on scapular retraction/protractoin, biceps, triceps, and deltoids.  Pt refused to practice w/c pushups.  Continued ongoing discharge planning, review of POC, equipment needs.  Discussed with pt the importance of achieving LTGs prior to discharge to and achieving independence with BADLs and transfers.  Pt continues to state that his family will help him do anything he can't or chooses not to do.  Pt returned to room and transferred to bed (slideboard) with setup for placement and removal of board.  Pt required max A for sit>supine in bed.    Therapy Documentation Precautions:  Precautions Precautions: Fall Required Braces or Orthoses: Spinal Brace Spinal Brace: Thoracolumbosacral orthotic, Applied in sitting position Restrictions Weight Bearing Restrictions: No   Pain:  Pt c/o BLE  neuropathic pain, RN aware and repositioned  See Function Navigator for Current Functional Status.   Therapy/Group: Individual Therapy  [name], Savonna Birchmeier Chappell 02/08/2017, 12:23 PM
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
## 2017-02-08 NOTE — Progress Notes (Signed)
Physical Therapy Session Note  Patient Details  Name: Craig Calderon MRN: [id number] Date of Birth: 05-31-1991  Today's Date: 02/08/2017 PT Individual Time: 5188-4166 PT Individual Time Calculation (min): 53 min   Short Term Goals: Week 1:  PT Short Term Goal 1 (Week 1): Pt will perform bed mobiilty with mod A and use of leg loops.  PT Short Term Goal 1 - Progress (Week 1): Met PT Short Term Goal 2 (Week 1): Pt will demonstrate sitting tolerance for 30 min with no change in vitals.  PT Short Term Goal 2 - Progress (Week 1): Met PT Short Term Goal 3 (Week 1): Pt will transfer from bed<>w/c mod A with slide board.  PT Short Term Goal 3 - Progress (Week 1): Met PT Short Term Goal 4 (Week 1): Pt will verbalize proper pressure relief technique and timing.  PT Short Term Goal 4 - Progress (Week 1): Progressing toward goal PT Short Term Goal 5 (Week 1): Pt will propel manual w/c 75 ft with S.  PT Short Term Goal 5 - Progress (Week 1): Met  Skilled Therapeutic Interventions/Progress Updates:   Pt received supine in bed and agreeable to PT at bed level only. Pt reports that his legs are hurting too bad for OOB activity. PT instructed pt in BUE therex with weight ball and bar:  Lateral tap with ball Shoulder flexion with ball Chest press with weighted bar for ball tap 2 x 1 min  Shoulder press with weighted bar 2 x 12   PT donned leg loops with max assist . Sit<>supine transfers with mod assist from PT due to increased pain in the BLE and stomach on this day.  Sitting balance without UE support to play Wii Sported game. BUE movement with Wii remote without BOS x 15 min. No LOB noted while pt sitting EOB without UE support.   Throughout treatment pt required multiple prolonged rest breaks due to increased pain.  Pt left supine in bed with call bell in reach and all needs met.         Therapy Documentation Precautions:  Precautions Precautions: Fall Required Braces or Orthoses: Spinal  Brace Spinal Brace: Thoracolumbosacral orthotic, Applied in sitting position Restrictions Weight Bearing Restrictions: No General: PT Amount of Missed Time (min): 20 Minutes PT Missed Treatment Reason: Patient unwilling to participate;Pain Vital Signs: Therapy Vitals Temp: 98.7 F (37.1 C) Temp Source: Oral Pulse Rate: (!) 118 Resp: 18 BP: 138/81 Patient Position (if appropriate): Lying Oxygen Therapy SpO2: 99 % O2 Device: Not Delivered Pain: Pain Assessment Pain Score: 8    See Function Navigator for Current Functional Status.   Therapy/Group: Individual Therapy  Lorie Phenix 02/08/2017, 3:01 PM

## 2017-02-09 ENCOUNTER — Inpatient Hospital Stay (HOSPITAL_COMMUNITY): Payer: Self-pay

## 2017-02-09 ENCOUNTER — Inpatient Hospital Stay (HOSPITAL_COMMUNITY): Payer: Self-pay | Admitting: Physical Therapy

## 2017-02-09 MED ORDER — GABAPENTIN 300 MG PO CAPS
300.0000 mg | ORAL_CAPSULE | Freq: Three times a day (TID) | ORAL | Status: DC
  Administered 2017-02-09 – 2017-02-14 (×13): 300 mg via ORAL
  Filled 2017-02-09 (×15): qty 1

## 2017-02-09 MED ORDER — PREGABALIN 25 MG PO CAPS
25.0000 mg | ORAL_CAPSULE | Freq: Every day | ORAL | Status: DC
  Administered 2017-02-09 – 2017-02-11 (×3): 25 mg via ORAL
  Filled 2017-02-09 (×3): qty 1

## 2017-02-09 NOTE — Progress Notes (Signed)
Occupational Therapy Session Note  Patient Details  Name: Craig Calderon MRN: [id number] Date of Birth: 11/03/1990  Today's Date: 02/09/2017 OT Individual Time: 0900-1000 OT Individual Time Calculation (min): 60 min    Short Term Goals: Week 2:  OT Short Term Goal 1 (Week 2): Pt will doff pants seated EOB/BSC with mod A for balance while utilizing lateral leans OT Short Term Goal 2 (Week 2): Pt will perform slideboard transfers to drop arm BSC with min A OT Short Term Goal 3 (Week 2): Pt will perform slideboard tub bench transfers with mod A OT Short Term Goal 4 (Week 2): Pt will perform LB dressing tasks with min a at bed level  Skilled Therapeutic Interventions/Progress Updates:    Pt resting/asleep in bed upon arrival.  Pt required min verbal cues to arouse and lethargic with limited interaction at beginning of session.  Pt agreeable to therapy and performed supine>sit EOB with min A in preparation for slideboard transfer to w/c.  Pt directed assistance appropriately with no unsafe behaviors noted.  Pt completed grooming tasks at sink and propelled to gym.  Pt declined changing clothing at this time.  Pt issued a SCI backpack and contents reviewed.  Continued ongoing education regarding bowel program and pressure relief.  Pt remained in w/c in gym awaiting PT session.   Therapy Documentation Precautions:  Precautions Precautions: Fall Required Braces or Orthoses: Spinal Brace Spinal Brace: Thoracolumbosacral orthotic, Applied in sitting position Restrictions Weight Bearing Restrictions: No Pain: Pain Assessment Pain Assessment: 0-10 Pain Score: 8  Pain Location: Leg Pain Orientation: Right;Left Pain Descriptors / Indicators: Burning Pain Onset: On-going RN aware  See Function Navigator for Current Functional Status.   Therapy/Group: Individual Therapy  [name], Craig Calderon 02/09/2017, 10:10 AM
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
## 2017-02-09 NOTE — Progress Notes (Signed)
Occupational Therapy Session Note  Patient Details  Name: Craig Calderon MRN: [id number] Date of Birth: 10/10/1990  Today's Date: 02/09/2017 OT Individual Time: 4098-11911306-1336 OT Individual Time Calculation (min): 30 min    Short Term Goals: Week 2:  OT Short Term Goal 1 (Week 2): Pt will doff pants seated EOB/BSC with mod A for balance while utilizing lateral leans OT Short Term Goal 2 (Week 2): Pt will perform slideboard transfers to drop arm BSC with min A OT Short Term Goal 3 (Week 2): Pt will perform slideboard tub bench transfers with mod A OT Short Term Goal 4 (Week 2): Pt will perform LB dressing tasks with min a at bed level  Skilled Therapeutic Interventions/Progress Updates:    Pt received supine in bed, RN and PA present and completing abdominal suture care. Pt with increased abdominal pain after care, but agreeable to OT session. Completed LB dressing donning shorts at bed level. Pt requires Max A and increased time to complete task, requiring assist to initially thread LEs, able to pull over 50% of LEs, requires MaxA to roll to L and R to pull over buttocks. Pt requires verbal cues to initiate moving LEs to EOB using leg loops (versus therapist assist), requires MinA to achieve LE placement, ModA to achieve bringing trunk into upright position for sitting EOB. Pt able to complete lateral transfer to w/c with sliding board after setup with MinGuard assist for safety. Ended session with Pt sitting up in w/c, call bell and needs within reach.   Therapy Documentation Precautions:  Precautions Precautions: Fall Required Braces or Orthoses: Spinal Brace Spinal Brace: Thoracolumbosacral orthotic, Applied in sitting position Restrictions Weight Bearing Restrictions: No   Pain: Pt reports pain in Bil LEs, reports RN is aware and following. Pt also reports discomfort in abdominal region as RN/PA just recently in examining and reapplying dressings.   ADL: ADL ADL Comments: refer to  functional navigator  See Function Navigator for Current Functional Status.   Therapy/Group: Individual Therapy  Craig Calderon 02/09/2017, 3:55 PM
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
## 2017-02-09 NOTE — Progress Notes (Signed)
Occupational Therapy Note  Patient Details  Name: [name] P XXXRushing MRN: [id number] Date of Birth: 09/17/1990  Today's Date: 02/09/2017 OT Individual Time: 1330-1430 OT Individual Time Calculation (min): 60 min   Pt c/o ongoing BLE pain (unrated); RN aware Individual Therapy  Pt resting in w/c upon arrival.  Surgical PA previously in room to examine abdominal suture site.  Pt stated the exam was uncomfortable but agreeable to propelling to gym and play Wii games seated EOM.  After entering gym pt stated that he thought he had a BM and agreed that best plan was to return to room and transfer to bed to check.  Pt propelled back to room and set up w/c properly beside bed in preparation for transfer to bed (supervision).  Pt with small BM and required assistance with hygiene.  Pt rolled in bed with min A for BLE management.  Continued ongoing education regarding bowel program and establishing schedule.  Pt listened intently and verbalized understanding.  Unable to engaged in Wii games 2/2 time constraints.  Pt agreed to sit EOB with next therapy and play Wii games.  PT notified of pt's request.    [name], Danitza Schoenfeldt Chappell 02/09/2017, 2:44 PM

## 2017-02-09 NOTE — Progress Notes (Signed)
Physical Therapy Session Note  Patient Details  Name: Craig Calderon MRN: [id number] Date of Birth: 01/21/1991  Today's Date: 02/09/2017 PT Individual Time: 1000-1100 and 1500-1540 PT Individual Time Calculation (min): 60 min and 40 min (total 100 min)   Short Term Goals: Week 2:  PT Short Term Goal 1 (Week 2): Pt will verbalize proper pressure relief technique and timing.  PT Short Term Goal 2 (Week 2): Pt will transfer from bed<>w/c S with minimal cuing.  PT Short Term Goal 3 (Week 2): Pt will perform bed mobility with min A using leg loops.  PT Short Term Goal 4 (Week 2): Pt will demonstrate sitting tolerance in w/c for 2 hours with appropriate pressure relief. PT Short Term Goal 5 (Week 2): Pt will propel manual w/c 13250ft with S.   Skilled Therapeutic Interventions/Progress Updates: Tx 1: Pt received seated in w/c with handoff from OT; c/o pain in BLE as below and agreeable to treatment. Pt removed BLE legrests with independence, using leg loops to lift LEs out of the way. Transfer w/c <>mat table with setupA for board placement and close S during transfer; cues for increased anterior weight shift and hip clearance for skin protection and efficiency. Dynamic sitting balance on edge of mat while dribbling and shooting basketball; performed with LEs on floor and unsupported with table elevated. One major LOB anteriorly when dribbling with feet unsupported; pt regained balance without assist using BUEs however reports he was startled by it. Educated pt that part of our goal for therapy is to help pt learn what his boundaries are, progress his balance, safety and functional mobility, and part of that includes finding balance point and pushing that limit within reason. Short sit>long sit with minA for stabilizing LEs while repositioning hands on leg loops. Upon reaching semi-circle sit in long sitting pt reports he thinks he had a bowel movement. Returned to short sit with S using leg loops. Transfer  to bed with setupA as above. modA sit >supine for LE management. Rolling R/L modA to change brief and doff pants. Therapist performed hygiene totalA; pt noted to continue to have bowel movement. Remained on bedpan at end of session; instructed pt in waiting no more than 5-10 min before calling for assist to get off bedpan; pt in agreement. Left with all needs in reach. Alerted RN to drainage noted at abdominal incision below umbilicus.   Tx 2: Pt received supine in bed, c/o LE pain as described below and agreeable to treatment. Supine>sit with HOB elevated, bedrail and leg loops with S and increased time with therapist not offering pt assistance; note improving problem solving for technique and LE management when given time to facilitate problem solving. Sitting balance on EOB with S overall while engaged in wii games for dynamic balance challenge for internally produced perturbations. Returned to supine with modA for BLE management. +2 totalA to scoot toward [facility] with pt declining attempt d/t pain in abdomen at incision. Remained supine in bed at end of session, all needs in reach.      Therapy Documentation Precautions:  Precautions Precautions: Fall Required Braces or Orthoses: Spinal Brace Spinal Brace: Thoracolumbosacral orthotic, Applied in sitting position Restrictions Weight Bearing Restrictions: No Pain: Pain Assessment Pain Assessment: 0-10 Pain Score: 8  Pain Location: Leg Pain Orientation: Right;Left Pain Descriptors / Indicators: Burning Pain Onset: On-going   See Function Navigator for Current Functional Status.   Therapy/Group: Individual Therapy  Vista Lawmanlizabeth J Tygielski 02/09/2017, 11:16 AM
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
## 2017-02-10 ENCOUNTER — Inpatient Hospital Stay (HOSPITAL_COMMUNITY): Payer: Self-pay

## 2017-02-10 DIAGNOSIS — T8131XD Disruption of external operation (surgical) wound, not elsewhere classified, subsequent encounter: Secondary | ICD-10-CM

## 2017-02-10 DIAGNOSIS — E871 Hypo-osmolality and hyponatremia: Secondary | ICD-10-CM

## 2017-02-10 DIAGNOSIS — T8131XA Disruption of external operation (surgical) wound, not elsewhere classified, initial encounter: Secondary | ICD-10-CM

## 2017-02-10 NOTE — Plan of Care (Signed)
Problem: SCI BLADDER ELIMINATION Goal: RH STG MANAGE BLADDER WITH ASSISTANCE STG Manage Bladder With Assistance. Max  Outcome: Progressing Pt has foley catheter
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
## 2017-02-10 NOTE — Progress Notes (Signed)
Occupational Therapy Note  Patient Details  Name: Craig Calderon MRN: [id number] Date of Birth: 11/26/1990  Today's Date: 02/10/2017 OT Individual Time: 1400-1430 OT Individual Time Calculation (min): 30 min   Pt c/o increased abdominal pain (incision) with movement (unrated); RN aware Individual Therapy  Pt resting in bed upon arrival.  Pt stated he had been in bed all day.  Discussed importance with OOB time for increased activity tolerance/endurance and pressure relief.  Pt verbalized understanding.  I emphasized importance of pt initiating contact with staff/nursing to get OOB when therapy schedule has lengthy gaps.  Pt verbalized understanding.  Pt required min A for supine>sit EOB after pt moved BLE off edge of bed with use of leg loops.  Pt sat EOB with supervision to facilitate donning of TLSO.  Pt required assistance with placement of slideboard but performed transfers and removal of board at supervision level.  Pt completed grooming tasks at sink.  Reemphasized importance of being OOB, especially the next day when only one therapy scheduled.  Pt verbalized understanding.  Pt's Mom and Grandmother present and I updated them on pt's LTGs and current progress.    Lavone NeriLanier, [address] [facility] Center[facility]ell 02/10/2017, 2:51 PM

## 2017-02-10 NOTE — Progress Notes (Signed)
Occupational Therapy Session Note  Patient Details  Name: Craig Calderon P XXXRushing MRN: [id number] Date of Birth: 03/25/1991  Today's Date: 02/10/2017 OT Individual Time: [id number] OT Individual Time Calculation (min): 55 min    Short Term Goals: Week 2:  OT Short Term Goal 1 (Week 2): Pt will doff pants seated EOB/BSC with mod A for balance while utilizing lateral leans OT Short Term Goal 2 (Week 2): Pt will perform slideboard transfers to drop arm BSC with min A OT Short Term Goal 3 (Week 2): Pt will perform slideboard tub bench transfers with mod A OT Short Term Goal 4 (Week 2): Pt will perform LB dressing tasks with min a at bed level  Skilled Therapeutic Interventions/Progress Updates:    Pt asleep in bed upon arrival and required min verbal cues to arouse.  Pt slow to arouse but agreeable to therapy.  Pt sat EOB to eat breakfast with focus on sitting balance.  Focus on bed mobility and ongoing education regarding OOB schedule and bowel program.  Pt requested to remain in bed because "this is too early to get OOB." All needs within reach.   Therapy Documentation Precautions:  Precautions Precautions: Fall Required Braces or Orthoses: Spinal Brace Spinal Brace: Thoracolumbosacral orthotic, Applied in sitting position Restrictions Weight Bearing Restrictions: No Pain: Pt c/o ongoing neuropathic pain in BLE (unrated); pt stated he didn't want to talk about it because "they might start hurting more."  See Function Navigator for Current Functional Status.   Therapy/Group: Individual Therapy  [name], Amanda Pote Chappell 02/10/2017, 7:58 AM
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
## 2017-02-11 ENCOUNTER — Inpatient Hospital Stay (HOSPITAL_COMMUNITY): Payer: Self-pay

## 2017-02-11 DIAGNOSIS — T8131XS Disruption of external operation (surgical) wound, not elsewhere classified, sequela: Secondary | ICD-10-CM

## 2017-02-11 DIAGNOSIS — R000 Tachycardia, unspecified: Secondary | ICD-10-CM

## 2017-02-11 LAB — AEROBIC CULTURE W GRAM STAIN (SUPERFICIAL SPECIMEN): Special Requests: NORMAL

## 2017-02-11 LAB — AEROBIC CULTURE  (SUPERFICIAL SPECIMEN): CULTURE: NORMAL
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
## 2017-02-11 NOTE — Plan of Care (Signed)
Problem: RH SKIN INTEGRITY Goal: RH STG ABLE TO PERFORM INCISION/WOUND CARE W/ASSISTANCE STG Able To Perform Incision/Wound Care With Assistance. Mod  Outcome: Not Progressing Pt refused all wound care all shift, explained to pt importance of keeping dressings and wounds clean, purpose of iodoform packing, and principles of wound healing, however, pt becomes annoyed and instructs RN to leave.  Problem: RH PAIN MANAGEMENT Goal: RH STG PAIN MANAGED AT OR BELOW PT'S PAIN GOAL Lower than 5  Outcome: Not Progressing Pt c/o pain of 10/10 inspite of taking oxy q 4 hours and robaxin q 6 hours ATC.
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
## 2017-02-11 NOTE — Plan of Care (Signed)
Problem: RH SKIN INTEGRITY Goal: RH STG SKIN FREE OF INFECTION/BREAKDOWN Free of infection and breakdown on rehab with max  Outcome: Not Progressing Patient refused full skin assessment and dressing changes.  Importance of both stressed to patient, assessment and dressing changes still refused.  Patient not progressing towards goal.

## 2017-02-11 NOTE — Plan of Care (Signed)
Problem: SCI BOWEL ELIMINATION Goal: RH STG MANAGE BOWEL WITH ASSISTANCE STG Manage Bowel with Assistance. Mod  Outcome: Progressing Pt compliant with bowel program, suppository given as ordered.

## 2017-02-11 NOTE — Progress Notes (Signed)
End of shift summary, Dr Allena KatzPatel notified pt refused lovenox injections yesterday and also refused all wound care including to the tunnel in the abdominal incision.  Pt continues to take oxy 15mg  q 4 hours and robaxin q6 hours and stating his pain is 10/10 at all times.

## 2017-02-11 NOTE — Progress Notes (Signed)
Occupational Therapy Session Note  Patient Details  Name: Craig Calderon MRN: [id number] Date of Birth: September 17, 1990  Today's Date: 02/11/2017 OT Individual Time: 0786-7544 OT Individual Time Calculation (min): 60 min     Skilled Therapeutic Interventions/Progress Updates:    1:1. Pt semi reclined in bed upon arrival reporting pain in BLE. RN aware. Pt supine>EOB with MAX A for LE management and trunk facilitation with Vc for back precautions. TLSO applied EOB with MAX A. Pt slide board transfer EOB<>w/c<>EOM with MIN A overall for placement of board and Vc for head hip relationship. Pt request to eat part of dinner MOD I prior to leaving for therapy gym. Pt reporting fatigue and refuse to push w/c. OT provides total A to propel w/c down to gym for energy conservation. Pt shoots basketball seated EOM with touching A fading to supervision for sitting balance. Pt hesitant to flex trunk and reach outside BOS 2/2 fear of falling. Pt reporting feeling "sick" and "dehydrated." OT assessed vitals at 120/85 and O2 99% pulse at 104. OT noticed catheter not draining well into collection bag, attempted to fix however unable. OT reported to RN as upon exiting room. Pt left semi reclined in bed with call light in reach and all needs met.   Therapy Documentation Precautions:  Precautions Precautions: Fall Required Braces or Orthoses: Spinal Brace Spinal Brace: Thoracolumbosacral orthotic, Applied in sitting position Restrictions Weight Bearing Restrictions: No  See Function Navigator for Current Functional Status.   Therapy/Group: Individual Therapy  Tonny Branch 02/11/2017, 12:41 PM

## 2017-02-12 ENCOUNTER — Inpatient Hospital Stay (HOSPITAL_COMMUNITY): Payer: Self-pay | Admitting: Physical Therapy

## 2017-02-12 ENCOUNTER — Inpatient Hospital Stay (HOSPITAL_COMMUNITY): Payer: Self-pay

## 2017-02-12 LAB — BASIC METABOLIC PANEL
ANION GAP: 7 (ref 5–15)
BUN: 11 mg/dL (ref 6–20)
CALCIUM: 8.8 mg/dL — AB (ref 8.9–10.3)
CHLORIDE: 96 mmol/L — AB (ref 101–111)
CO2: 27 mmol/L (ref 22–32)
Creatinine, Ser: 0.93 mg/dL (ref 0.61–1.24)
GFR calc non Af Amer: >60 mL/min (ref >60)
Glucose, Bld: 97 mg/dL (ref 65–99)
Potassium: 4.8 mmol/L (ref 3.5–5.1)
Sodium: 130 mmol/L — ABNORMAL LOW (ref 135–145)

## 2017-02-12 LAB — CBC
HEMATOCRIT: 30.8 % — AB (ref 39.0–52.0)
HEMOGLOBIN: 9.8 g/dL — AB (ref 13.0–17.0)
MCH: 27.5 pg (ref 26.0–34.0)
MCHC: 31.8 g/dL (ref 30.0–36.0)
MCV: 86.3 fL (ref 78.0–100.0)
Platelets: 627 10*3/uL — ABNORMAL HIGH (ref 150–400)
RBC: 3.57 MIL/uL — ABNORMAL LOW (ref 4.22–5.81)
RDW: 14.6 % (ref 11.5–15.5)
WBC: 12.3 10*3/uL — ABNORMAL HIGH (ref 4.0–10.5)

## 2017-02-12 MED ORDER — NORTRIPTYLINE HCL 25 MG PO CAPS
25.0000 mg | ORAL_CAPSULE | Freq: Every day | ORAL | Status: DC
  Administered 2017-02-12: 25 mg via ORAL
  Filled 2017-02-12: qty 1

## 2017-02-12 NOTE — Progress Notes (Signed)
Physical Therapy Session Note  Patient Details  Name: Craig Calderon MRN: [id number] Date of Birth: 12/06/1990  Today's Date: 02/12/2017 PT Individual Time: 1400-1500 PT Individual Time Calculation (min): 60 min   Short Term Goals: Week 2:  PT Short Term Goal 1 (Week 2): Pt will verbalize proper pressure relief technique and timing.  PT Short Term Goal 2 (Week 2): Pt will transfer from bed<>w/c S with minimal cuing.  PT Short Term Goal 3 (Week 2): Pt will perform bed mobility with min A using leg loops.  PT Short Term Goal 4 (Week 2): Pt will demonstrate sitting tolerance in w/c for 2 hours with appropriate pressure relief. PT Short Term Goal 5 (Week 2): Pt will propel manual w/c [id number] with S.   Skilled Therapeutic Interventions/Progress Updates: Pt received seated in w/c in gym with handoff from OT; c/o pain as below and agreeable to treatment with encouragement. Transfer w/c <>mat table with minA to setup board, and assist when pants got caught on board. Short sit >long sit maxA. Sitting balance in long sitting while performing BLE PROM in figure 4 position with towel looped around foot for gastroc/soleus and opposite LE in circle sit for hip ER stretch. Showed pt video of thoracic level SCI performing w/c <>bed and car transfers without board, discussed long term goal of reducing reliance on board as pt becomes stronger and more confident. Sitting balance in long sit while catching/throwing basketball with no LOB noted. Returned to short sit with S and increased time using leg loops. Transferred back to w/c with minA as above. Pt refused attempting to lock far brake prior to transferring to chair; reports he can't and he doesn't need to because someone will always be with him. Discussed with pt that he has the potential to be much more independent with all mobility activities and encouraged him to push himself to try progressing activities to reduce reliance on other people. Returned to room  totalA d/t pt declining self-propulsion. Remained seated in w/c at end of session with handoff to RN to administer medication, all needs in reach.      Therapy Documentation Precautions:  Precautions Precautions: Fall Required Braces or Orthoses: Spinal Brace Spinal Brace: Thoracolumbosacral orthotic, Applied in sitting position Restrictions Weight Bearing Restrictions: No Pain: Pain Assessment Pain Assessment: 0-10 Pain Score: 8  Pain Type: Acute pain Pain Location: Leg Pain Orientation: Right;Left Pain Descriptors / Indicators: Burning Pain Onset: On-going Patients Stated Pain Goal: 5 Pain Intervention(s): Medication (See eMAR)   See Function Navigator for Current Functional Status.   Therapy/Group: Individual Therapy  Vista Lawmanlizabeth J Tygielski 02/12/2017, 3:38 PM

## 2017-02-12 NOTE — Progress Notes (Signed)
Physical Therapy Session Note  Patient Details  Name: Craig Calderon MRN: [id number] Date of Birth: March 04, 1991  Today's Date: 02/12/2017 PT Individual Time: 800-900; 1100-1200 PT Individual Time Calculation (min): 60 min; 60 min   Short Term Goals: Week 2:  PT Short Term Goal 1 (Week 2): Pt will verbalize proper pressure relief technique and timing.  PT Short Term Goal 2 (Week 2): Pt will transfer from bed<>w/c S with minimal cuing.  PT Short Term Goal 3 (Week 2): Pt will perform bed mobility with min A using leg loops.  PT Short Term Goal 4 (Week 2): Pt will demonstrate sitting tolerance in w/c for 2 hours with appropriate pressure relief. PT Short Term Goal 5 (Week 2): Pt will propel manual w/c 147f with S.   Skilled Therapeutic Interventions/Progress Updates:  Tx 1: Pt received supine in bed with PRAFO boots donned. TED hose and leg loops donned. Pt reluctant to get out of bed this morning but agreed to get up after encouragement. Pt able to move LEs off EOB and required min A for supine>sit and more than usual amount of time to perform this task. Slide board transfer from bed>w/c with min A for placement of slide board. Once seated in w/c, pt reports feeling like he had a bowel movement; slide board transfer with min A from w/c>bed. Mod A sit>supine for management of B LEs. Discussed with pt having his family present for a full day of therapy later this week for education and hands-on practice; pt agreeable to this. PT reinforced importance of pressure relief and changing position throughout the night. Pt left supine in bed with all needs in reach and to call for nursing staff for brief change.  Tx 2: Pt received seated in manual w/c at sink. Pt propelled w/c >1534fwith S from pt's room to ortho gym with 1 short rest break. Slide board transfer from w/c<> level car with pt directing transfer; required minimal cuing for sequencing and management of LEs and required min A for slide board  placement and guidance of LEs in/out of car. Pt total A back to pt's room as pt requested to be checked to see if he had a bowel movement. Slide board transfer from w/c>bed with min A and pt directing transfer. Pt declined managing his LEs for sit>supine; mod A sit>supine for management of B LEs. Pt mod A rolling to check brief. LE stretching by student PT with pt in supine. PT demonstrated technique for independent stretching in long sitting. Pt left supine in bed with all needs met.   Therapy Documentation Precautions:  Precautions Precautions: Fall Required Braces or Orthoses: Spinal Brace Spinal Brace: Thoracolumbosacral orthotic, Applied in sitting position Restrictions Weight Bearing Restrictions: No   See Function Navigator for Current Functional Status.   Therapy/Group: Individual Therapy  CaAlysia Penna/07/2017, 12:10 PM

## 2017-02-12 NOTE — Plan of Care (Signed)
Problem: RH SKIN INTEGRITY Goal: RH STG ABLE TO PERFORM INCISION/WOUND CARE W/ASSISTANCE STG Able To Perform Incision/Wound Care With Assistance. Mod  Outcome: Not Progressing Nursing providing wound care at this time.  Problem: RH PAIN MANAGEMENT Goal: RH STG PAIN MANAGED AT OR BELOW PT'S PAIN GOAL Lower than 5  Outcome: Not Progressing Consistently rates pain 8-10/10

## 2017-02-12 NOTE — Progress Notes (Signed)
Occupational Therapy Note  Patient Details  Name: Craig Calderon MRN: [id number] Date of Birth: 11/06/1990  Today's Date: 02/12/2017 OT Individual Time: 1300-1400 OT Individual Time Calculation (min): 60 min   Pt c/o ongoing BLE neuropathic pain; RN aware Individual Therapy  Focus on bed mobility, functional transfers, and ongoing discharge planning.  Pt requires more than a reasonable amount of time to initiate all transitional movements.  Pt required assistance with supine>sit EOB with HOB elevated approx 45 degrees.  Pt maintained sitting balance EOB at supervision level.  Pt required assistance with placement of slideboard and performed transfers at close supervision level.  Pt propelled to gym and issued mobility gloves.  Pt remained in gym awaiting PT session.    Lavone NeriLanier, Ashby Moskal [facility]ell 02/12/2017, 3:08 PM
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
## 2017-02-12 NOTE — Progress Notes (Signed)
Occupational Therapy Session Note  Patient Details  Name: [name] P XXXRushing MRN: [id number] Date of Birth: 05/03/1991  Today's Date: 02/12/2017 OT Individual Time: 1000-1100 OT Individual Time Calculation (min): 60 min    Short Term Goals: Week 2:  OT Short Term Goal 1 (Week 2): Pt will doff pants seated EOB/BSC with mod A for balance while utilizing lateral leans OT Short Term Goal 2 (Week 2): Pt will perform slideboard transfers to drop arm BSC with min A OT Short Term Goal 3 (Week 2): Pt will perform slideboard tub bench transfers with mod A OT Short Term Goal 4 (Week 2): Pt will perform LB dressing tasks with min a at bed level  Skilled Therapeutic Interventions/Progress Updates:    Pt resting in bed with covers over head upon arrival.  Pt easily aroused. Ongoing focus on bed mobility and functional transfers.  Pt requires more than a reasonable amount of time to initiate tasks.  Pt required assistance with placing LLE over edge of bed and mod A for supine>sit EOB with HOB elevated.  Pt completed grooming tasks at sink.  Continued education regarding pressure relief and consequences.  Pt verbalized understanding.  Pt remained in w/c with all needs within reach.   Therapy Documentation Precautions:  Precautions Precautions: Fall Required Braces or Orthoses: Spinal Brace Spinal Brace: Thoracolumbosacral orthotic, Applied in sitting position Restrictions Weight Bearing Restrictions: No  Pain: Pain Assessment Pain Assessment: 0-10 Pain Score: 8  Pain Type: Neuropathic pain Pain Location: Leg Pain Orientation: Right;Left Pain Descriptors / Indicators: Burning Pain Frequency: Constant Pain Onset: On-going Patients Stated Pain Goal: 4 Pain Intervention(s): RN aware   See Function Navigator for Current Functional Status.   Therapy/Group: Individual Therapy  [name], Fatemah Pourciau Chappell 02/12/2017, 11:10 AM

## 2017-02-12 NOTE — Progress Notes (Signed)
Patient slept for short periods of time last night.  Pt denied having any nightmares last pm.  Pt woke once during the night with complaint of bilateral leg pain.  Pain described as pins and needles as well as sensation of having a "charlie horse" that went from right to left leg.  Patient requested pain medication which was effective in reducing pain level.  Pt did agree to receive Lovenox last PM.  Pt had a small BM this morning.  Small area of open skin noted in gluteal cleft.  Area cleansed.  Patient tolerated well.

## 2017-02-12 NOTE — Plan of Care (Signed)
Problem: RH SKIN INTEGRITY Goal: RH STG SKIN FREE OF INFECTION/BREAKDOWN Free of infection and breakdown on rehab with max  Outcome: Not Progressing Patient refuses position changes and full skin assessments.  Pt not progressing towards goal.

## 2017-02-13 ENCOUNTER — Inpatient Hospital Stay (HOSPITAL_COMMUNITY): Payer: Self-pay

## 2017-02-13 ENCOUNTER — Inpatient Hospital Stay (HOSPITAL_COMMUNITY): Payer: Self-pay | Admitting: Occupational Therapy

## 2017-02-13 ENCOUNTER — Inpatient Hospital Stay (HOSPITAL_COMMUNITY): Payer: Self-pay | Admitting: Physical Therapy

## 2017-02-13 MED ORDER — NORTRIPTYLINE HCL 10 MG PO CAPS
50.0000 mg | ORAL_CAPSULE | Freq: Every day | ORAL | Status: DC
  Administered 2017-02-14 – 2017-02-18 (×5): 50 mg via ORAL
  Filled 2017-02-13 (×6): qty 5

## 2017-02-13 NOTE — Progress Notes (Signed)
Patient refused BID dressing change to abdoment

## 2017-02-13 NOTE — Progress Notes (Signed)
Occupational Therapy Session Note  Patient Details  Name: Craig Calderon P XXXRushing MRN: [id number] Date of Birth: 11/03/1990  Today's Date: 02/13/2017 OT Individual Time: 1300-1355 OT Individual Time Calculation (min): 55 min   Skilled Therapeutic Interventions/Progress Updates:    1:1. Pt supine in bed refusing all OOB or EOB activity. Educated on importance of participation in transfers and OOB activity to improve balance, strength and coordination to impact independence in ADLs/functional mobility. Pt said, "I know I need to, but I just dont want to." Pt able to recall pressure relief protocol, however pt reports not calling RN to assist with rolling despite knowing he needs to. Educated pt on importance of pressure relief and severity of possibility for pressure sore. OT assisted in bed mobility with MOD A for LE facilitation and VC for pt to use leg loops to assist with LE management. Pt agreeable to complete 1x15 of UE therex in all planes of motion using green theraband and 4# dowel rod with instructional cueing for technique.   Exercises are as follows:  shoulder: flex/ext, int/ext rotation, ab/adduciton, row, press, PNF diagonals D1/D2 chest press Elbow: flex/ext  Therapy Documentation Precautions:  Precautions Precautions: Fall Required Braces or Orthoses: Spinal Brace Spinal Brace: Thoracolumbosacral orthotic, Applied in sitting position Restrictions Weight Bearing Restrictions: No ADL: ADL ADL Comments: refer to functional navigator  See Function Navigator for Current Functional Status.   Therapy/Group: Individual Therapy  Shon HaleStephanie M Orlen Leedy 02/13/2017, 2:06 PM

## 2017-02-13 NOTE — Progress Notes (Signed)
Occupational Therapy Session Note  Patient Details  Name: [name] P XXXRushing MRN: [id number] Date of Birth: 09/07/1990  Today's Date: 02/13/2017 OT Individual Time: 1100-1130 OT Individual Time Calculation (min): 30 min  and Today's Date: 02/13/2017 OT Missed Time: 30 Minutes Missed Time Reason: Patient unwilling/refused to participate without medical reason   Short Term Goals: Week 2:  OT Short Term Goal 1 (Week 2): Pt will doff pants seated EOB/BSC with mod A for balance while utilizing lateral leans OT Short Term Goal 2 (Week 2): Pt will perform slideboard transfers to drop arm BSC with min A OT Short Term Goal 3 (Week 2): Pt will perform slideboard tub bench transfers with mod A OT Short Term Goal 4 (Week 2): Pt will perform LB dressing tasks with min a at bed level  Skilled Therapeutic Interventions/Progress Updates:    Pt resting in bed upon arrival with PT present.  Continued ongoing/continuance of discussion regarding pt's discharge plan and amount of assistance available at home.  Pt consistently states that his Mom will be available to assist.  Discussed BSC and tub bench transfers.  Pt [facility] to [facility] "doesn't know when he is going to have a bowel movement." Pt has no interest in tub bench transfers.  Pt refused to get OOB and stated his stomach hurts when he sits up.  Pt missed 30 mins skilled OT services 2/2 pt's refusal to actively participate.  Pt's Mom scheduled for education tomorrow.   Therapy Documentation Precautions:  Precautions Precautions: Fall Required Braces or Orthoses: Spinal Brace Spinal Brace: Thoracolumbosacral orthotic, Applied in sitting position Restrictions Weight Bearing Restrictions: No General: General OT Amount of Missed Time: 30 Minutes  Pain: Pain Assessment Pain Assessment: 0-10 Pain Score: 10-Worst pain ever Pain Type: Acute pain Pain Location: Leg Pain Orientation: Right;Left Pain  Descriptors / Indicators: Burning Pain Frequency: Constant Pain Onset: On-going Patients Stated Pain Goal: 5 Pain Intervention(s): Medication (See eMAR)  See Function Navigator for Current Functional Status.   Therapy/Group: Individual Therapy  [name], Aeriel Boulay Chappell 02/13/2017, 12:08 PM

## 2017-02-13 NOTE — Progress Notes (Signed)
Occupational Therapy Session Note  Patient Details  Name: Craig Calderon MRN: [id number] Date of Birth: 03/17/1991  Today's Date: 02/13/2017 OT Individual Time: 0700-0727 OT Individual Time Calculation (min): 27 min    Short Term Goals: Week 1:  OT Short Term Goal 1 (Week 1): Pt will be able to tolerate sitting on EOB for 10 min to wash UB and don TLSO with close S.  OT Short Term Goal 1 - Progress (Week 1): Met OT Short Term Goal 2 (Week 1): Pt will be able to wash 75% of his lower legs with set up A from bed level. OT Short Term Goal 2 - Progress (Week 1): Met OT Short Term Goal 3 (Week 1): Pt will be able to thread his pants over his feet with adaptive techniques with min A.   OT Short Term Goal 3 - Progress (Week 1): Met OT Short Term Goal 4 (Week 1): Pt will be able to transfer to W/c with sliding board with mod A of 1 to prepare for grooming at the sink. OT Short Term Goal 4 - Progress (Week 1): Met  Skilled Therapeutic Interventions/Progress Updates:    1;1. Pt asleep requiring increased time to arouse with sternal run and VC. Pt reporting pain in BLE and already received medication. Focus of session on sitting balance, bed mobility, and ADL retraining. Pt supine<>sit with MAX A for LE management and trunk facilitation. Pt requires encouragement to reach for shirt on table MIN ranges outside BOS. Pt maintains sitting balance with supervision seated EOB to doff/don pull over shirt. Exited session with pt supine in bed with call light in  Reach.  Therapy Documentation Precautions:  Precautions Precautions: Fall Required Braces or Orthoses: Spinal Brace Spinal Brace: Thoracolumbosacral orthotic, Applied in sitting position Restrictions Weight Bearing Restrictions: No  ADL: ADL ADL Comments: refer to functional navigator  See Function Navigator for Current Functional Status.   Therapy/Group: Individual Therapy  Tonny Branch 02/13/2017, 10:33 AM
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
## 2017-02-13 NOTE — Progress Notes (Signed)
Physical Therapy Weekly Progress Note  Patient Details  Name: Craig Calderon MRN: [id number] Date of Birth: 06-01-1991  Beginning of progress report period: February 06, 2017 End of progress report period: February 13, 2017  Today's Date: 02/13/2017 PT Individual Time:  1000-1100 PT Individual Time Calculation (min): 60 min   Patient has met 1 of 5 short term goals.  Pt continues to be min A for slide board transfers and mod A for bed mobility overall. Pt demonstrates improved use of leg loops for mobility and is able to propel manual w/c 135f with S when pt elects to do so. Pt has been provided with educational hand-outs for further information on SCI. Pt self-limiting due to lack of motivation to progress toward increased independence. Pt's family to come this week to initiate family education and hands-on training necessary prior to discharge.   Patient continues to demonstrate the following deficits muscle weakness, muscle joint tightness and muscle paralysis, decreased cardiorespiratoy endurance, abnormal tone and decreased sitting balance, decreased postural control and decreased balance strategies and therefore will continue to benefit from skilled PT intervention to increase functional independence with mobility.  Patient progressing toward long term goals..  Continue plan of care.  PT Short Term Goals Week 2:  PT Short Term Goal 1 (Week 2): Pt will verbalize proper pressure relief technique and timing.  PT Short Term Goal 2 (Week 2): Pt will transfer from bed<>w/c S with minimal cuing.  PT Short Term Goal 3 (Week 2): Pt will perform bed mobility with min A using leg loops.  PT Short Term Goal 4 (Week 2): Pt will demonstrate sitting tolerance in w/c for 2 hours with appropriate pressure relief. PT Short Term Goal 5 (Week 2): Pt will propel manual w/c 1510fwith S.  Week 3:  PT Short Term Goal 1 (Week 3): Pt will perform bed mobility with min A using leg loops.  PT Short Term Goal 2 (Week  3): Pt will demonstrate sitting tolerance in w/c for 2 hours with appropriate pressure relief.  PT Short Term Goal 3 (Week 3): Pt will direct assist needed for slide board transfer from bed<>w/c to family member with min A.  PT Short Term Goal 4 (Week 3): Pt will demonstrate LE stretching program with min A.   Skilled Therapeutic Interventions/Progress Updates:  Pt received supine in bed finishing OT session. Pt reports LE pain and states that he does not want to get up into the wheelchair. After encouragement, pt agreed to attempt supine>long sitting in bed. With HOOrthopaedic Surgery Centerlevated pt attempted supine>sit using sheet tied to the foot of the bed to pull himself up. Pt completed 50% of task before complaining of pain in his abdominal incision and expressing concern that this activity would damage his incision. Pt provided with educational handouts on pain after SCI and pressure relief by PT. Discussed with pt the importance of increased independence to improve pt's QOL and reduce caregiver burden. Pt called his mother; pt and PT discussed with pt's mother over the phone family education and initiation of hands-on training this week; pt's mother agreeable to this. After presented with choices, pt chose to focus remainder of session on rolling in bed. Pt takes more than normal amount of time with task, requires min A to stabilize LE during roll, and verbal cues for sequencing but was able to pull pants over hips when stabilized in sidelying. Pt expressed need to change brief. Mod A rolling and total A for peri hygiene and  to change brief and pants. Pt left supine in bed with hand-off to OT.   Therapy Documentation Precautions:  Precautions Precautions: Fall Required Braces or Orthoses: Spinal Brace Spinal Brace: Thoracolumbosacral orthotic, Applied in sitting position Restrictions Weight Bearing Restrictions: No   See Function Navigator for Current Functional Status.  Therapy/Group: Individual  Therapy  Alysia Penna 02/13/2017, 4:37 PM

## 2017-02-13 NOTE — Progress Notes (Signed)
Occupational Therapy Session Note  Patient Details  Name: Craig Calderon MRN: [id number] Date of Birth: 01/26/1991  Today's Date: 02/13/2017 OT Individual Time: 0934-1000 OT Individual Time Calculation (min): 26 min    Short Term Goals: Week 2:  OT Short Term Goal 1 (Week 2): Pt will doff pants seated EOB/BSC with mod A for balance while utilizing lateral leans OT Short Term Goal 2 (Week 2): Pt will perform slideboard transfers to drop arm BSC with min A OT Short Term Goal 3 (Week 2): Pt will perform slideboard tub bench transfers with mod A OT Short Term Goal 4 (Week 2): Pt will perform LB dressing tasks with min a at bed level  Skilled Therapeutic Interventions/Progress Updates:    Pt received supine in bed, reporting increased neuropathic pain in bil LEs. Declining completing ADLs this session but agreeable to to UE exercise with Max encouragement. Pt completed bil UE exercise using 2 kg exercise ball at bed level for 10-12 reps each exercise. Pt requiring frequent rest breaks due to pain and requires Max encouragement to participate throughout session. Reviewed pt education during rest breaks including pressure relief techniques and general POC. Pt completing bed mobility during session with ModA to achieve sidelying to L side with use of handrails. Ended session with Pt supine in bed, PT entering room to begin therapy session.   Therapy Documentation Precautions:  Precautions Precautions: Fall Required Braces or Orthoses: Spinal Brace Spinal Brace: Thoracolumbosacral orthotic, Applied in sitting position Restrictions Weight Bearing Restrictions: No   Pain: Pt reporting neuropathic pain in Bil LEs, having already received medications this AM. Assisted Pt with repositioning, distraction to alleviate/decrease pain levels.   ADL: ADL ADL Comments: refer to functional navigator  See Function Navigator for Current Functional Status.   Therapy/Group: Individual  Therapy  Orlando PennerBreanna L Giovannie Scerbo 02/13/2017, 12:00 PM
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
## 2017-02-13 NOTE — Progress Notes (Signed)
Social Work Patient ID: Craig Calderon, male   DOB: 03/16/1991, 26 y.o.   MRN: [id number]   CSW met with pt to update him on team conference discussion.  Pt stated his mother will be here tomorrow morning for family education and explained to pt that we needed them to see how much care he will need at home.  Pt wants to go home sooner, but CSW told him we'd see how tomorrow goes.  Then, CSW reiterated that our goal was to get him as independent as possible so his family would not have to do so much.  Pt stated, "I've come this far."  CSW tried to expand on that to encourage him to keep going because he has come this far and can keep going even further.  Pt did give CSW eye contact, but did not say much more.  CSW will continue to follow after family education tomorrow.  No change to d/c date at this time.  

## 2017-02-14 ENCOUNTER — Ambulatory Visit (HOSPITAL_COMMUNITY): Payer: Self-pay | Admitting: Physical Therapy

## 2017-02-14 ENCOUNTER — Inpatient Hospital Stay (HOSPITAL_COMMUNITY): Payer: Self-pay | Admitting: *Deleted

## 2017-02-14 ENCOUNTER — Inpatient Hospital Stay (HOSPITAL_COMMUNITY): Payer: Self-pay | Admitting: Physical Therapy

## 2017-02-14 ENCOUNTER — Encounter (HOSPITAL_COMMUNITY): Payer: Self-pay

## 2017-02-14 NOTE — Progress Notes (Addendum)
Occupational Therapy Note  Patient Details  Name: Craig Calderon P XXXRushing MRN: [id number] Date of Birth: 02/05/1991  Today's Date: 02/14/2017 OT Individual Time: 1330-1400 OT Individual Time Calculation (min): 30 min  OT MIssed time: 30 mins; pt ill and general discomfort  Pt c/o generalized pain/discomfort; RN aware Individual Therapy  Pt resting in bed upon arrival.  Pt with ongoing c/o generalized discomfort.  Continued discharge planning and education regarding OOB schedule, bed mobility/pressure relief, and directing care.  Pt declined getting OOB.  Pt declined BUE therex at bed level.  Pt remained in bed with all needs within reach. Pt missed 30 mins skilled OT services 2/2 refusal to actively participate.    Craig Calderon, Craig Calderon [facility] 02/14/2017, 2:04 PM

## 2017-02-14 NOTE — Progress Notes (Signed)
Physical Therapy Session Note  Patient Details  Name: Craig Calderon MRN: [id number] Date of Birth: 06/06/1991  Today's Date: 02/14/2017 PT Individual Time: 1100-1200 PT Individual Time Calculation (min): 60 min   Short Term Goals: Week 3:  PT Short Term Goal 1 (Week 3): Pt will perform bed mobility with min A using leg loops.  PT Short Term Goal 2 (Week 3): Pt will demonstrate sitting tolerance in w/c for 2 hours with appropriate pressure relief.  PT Short Term Goal 3 (Week 3): Pt will direct assist needed for slide board transfer from bed<>w/c to family member with min A.  PT Short Term Goal 4 (Week 3): Pt will demonstrate LE stretching program with min A.   Skilled Therapeutic Interventions/Progress Updates:  Pt received supine in bed with pt's mother present for family education and initiation of hands on training. Pt reports B leg pain but agreeable to sit up in w/c. Pt able to direct transfer from bed>w/c to his mother using slide board with minimal cuing from PT. [name] donned R TED hose and leg loop with cuing from pt while student PT donned L side. [name] expressed need to rest and pt moved LEs off the edge of bed using leg loops. Pt required min A from pt's mother for the remainder of supine>sit and scoot pt's L hip forward to prepare for slide board transfer. PT instructed pt's mother on technique for donning spinal brace with pt seated EOB; pt recognized need to donn spinal brace before performing slide board transfer and was able to communicate this with pt's mother. [name] requesting to rest after this describing her own limitations but was agreeable to practice placing slide board, as this step is the only other assistance pt requires for transfer. Educated pt's mother on placement of slide board for safe transfer and pt able to recognize when slide board is not positioned correctly. With assistance managing pt's LE, pt able to place slide board; pt performed slide  board transfer and was able to manage LEs during transfer. Pt and pt's mother educated on pressure relief technique and timing, goals for pt to have increased independence with mobility, pain after SCI, and discussed practicing car transfer this weekend. [name] had further equipment and medical questions; pt's social worker alerted of this. Pt left seated in w/c with all needs in reach.   Therapy Documentation Precautions:  Precautions Precautions: Fall Required Braces or Orthoses: Spinal Brace Spinal Brace: Thoracolumbosacral orthotic, Applied in sitting position Restrictions Weight Bearing Restrictions: No   See Function Navigator for Current Functional Status.   Therapy/Group: Individual Therapy  Craig Calderon 02/14/2017, 12:32 PM

## 2017-02-14 NOTE — Progress Notes (Signed)
Recreational Therapy Discharge Summary Patient Details  Name: Craig Calderon MRN: [id number] Date of Birth: 07/05/1991 Today's Date: 02/14/2017  Reasons for discharge: Pt with limited interest in TR participation & requires max encouragment with other disciplines. Patient/family agrees with progress made and goals achieved: Yes  Craig Calderon 02/14/2017, 4:22 PM

## 2017-02-14 NOTE — Progress Notes (Signed)
Occupational Therapy Weekly Progress Note  Patient Details  Name: Craig Calderon MRN: [id number] Date of Birth: 09/24/1990  Beginning of progress report period: February 07, 2017 End of progress report period: February 14, 2017  Patient has met 1 of 4 short term goals.  Although pt has made steady progress with bed mobility and functional SBT bed<>w/c, pt continues to decline practicing BSC and tub bench transfers.  Pt continues to insist that there is no purpose in in learning to transfer to [facility] because he doesn't know when he needs to have a bowel movement.  Pt declines practicing tub bench transfers because he is not going to take a shower unitl his stomach incision heals.  Pt informed OT that he will practice then.  Pt requires max encouragement to participate in therapies and requires more than a reasonable amount of time to initiate and complete tasks.  Pt is able to don pants with min A at bed level but continues to decline attempting lateral leans for doffing/donning pants.  Education is ongoing as it pertains to his ability to gain maximum independence at w/c level.  Education is ongoing as it pertains to pressure relief and consequences of decubitus ulcers.  Pt verbalizes understanding but has failed to initiate follow up.  Pt's Mom has been present to initiate education  Patient continues to demonstrate the following deficits:paraplegia at level T12 and therefore will continue to benefit from skilled OT intervention to enhance overall performance with BADL and Reduce care partner burden.  Patient progressing toward long term goals..  Continue plan of care.  OT Short Term Goals Week 2:  OT Short Term Goal 1 (Week 2): Pt will doff pants seated EOB/BSC with mod A for balance while utilizing lateral leans OT Short Term Goal 1 - Progress (Week 2): Progressing toward goal OT Short Term Goal 2 (Week 2): Pt will perform slideboard transfers to drop arm BSC with min A OT Short Term Goal 2 - Progress  (Week 2): Progressing toward goal OT Short Term Goal 3 (Week 2): Pt will perform slideboard tub bench transfers with mod A OT Short Term Goal 3 - Progress (Week 2): Progressing toward goal OT Short Term Goal 4 (Week 2): Pt will perform LB dressing tasks with min a at bed level OT Short Term Goal 4 - Progress (Week 2): Met Week 3:  OT Short Term Goal 1 (Week 3): STG=LTG 2/2 ELOS      Therapy Documentation Precautions:  Precautions Precautions: Fall Required Braces or Orthoses: Spinal Brace Spinal Brace: Thoracolumbosacral orthotic, Applied in sitting position Restrictions Weight Bearing Restrictions: No  See Function Navigator for Current Functional Status.   Therapy/Group: Individual Therapy  Leroy Libman 02/14/2017, 2:16 PM

## 2017-02-14 NOTE — Progress Notes (Signed)
Physical Therapy Session Note  Patient Details  Name: Craig Calderon P XXXRushing MRN: [id number] Date of Birth: 06/01/1991  Today's Date: 02/14/2017 PT Individual Time: [id number] PT Individual Time Calculation (min): 57 min   Short Term Goals: Week 2:  PT Short Term Goal 1 (Week 2): Pt will verbalize proper pressure relief technique and timing.  PT Short Term Goal 2 (Week 2): Pt will transfer from bed<>w/c S with minimal cuing.  PT Short Term Goal 3 (Week 2): Pt will perform bed mobility with min A using leg loops.  PT Short Term Goal 4 (Week 2): Pt will demonstrate sitting tolerance in w/c for 2 hours with appropriate pressure relief. PT Short Term Goal 5 (Week 2): Pt will propel manual w/c 11150ft with S.   Skilled Therapeutic Interventions/Progress Updates:   Pt in bed upon arrival, willing to participate with heavy encouragement. Bed mobility: supine<>side<>sitting via log roll performed with mod assist. Pt able to assist but needing additional time to initiate, pt requesting assistance for tasks he was able to perform. Transfer: slide board transfer performed from w/c<>bed with min assist. Encouraging pt to spend time sitting in w/c but refusing, requesting to return to bed upon transfer to chair. Pt refusing to work on w/c mobility or any other activity. Pt returned to supine per request. Pt in bed with all needs in reach. Pt noted to be agitated during portions of session, encouraging pt to direct care as much as possible for increased participation.   Therapy Documentation Precautions:  Precautions Precautions: Fall Required Braces or Orthoses: Spinal Brace Spinal Brace: Thoracolumbosacral orthotic, Applied in sitting position Restrictions Weight Bearing Restrictions: No  Pain:  Pt reports having abdominal and LE pain, did not respond to rating question. Nursing notified.   See Function Navigator for Current Functional Status.   Therapy/Group: Individual Therapy  Delton SeeBenjamin Jeannia Tatro,  PT 02/14/2017, 3:53 PM
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
## 2017-02-14 NOTE — Progress Notes (Signed)
Occupational Therapy Session Note  Patient Details  Name: Craig Calderon MRN: [id number] Date of Birth: 10/29/1990  Today's Date: 02/14/2017 OT Individual Time: [id number] OT Individual Time Calculation (min): 57 min    Short Term Goals: Week 2:  OT Short Term Goal 1 (Week 2): Pt will doff pants seated EOB/BSC with mod A for balance while utilizing lateral leans OT Short Term Goal 2 (Week 2): Pt will perform slideboard transfers to drop arm BSC with min A OT Short Term Goal 3 (Week 2): Pt will perform slideboard tub bench transfers with mod A OT Short Term Goal 4 (Week 2): Pt will perform LB dressing tasks with min a at bed level  Skilled Therapeutic Interventions/Progress Updates:    Pt Ptin bed upon arrival with Mom present for education.  Pt c/o generalized pain and "not feeling well." Vitals per below.  RN notified.  RN performed digital stimulation to assure bowels empty after AM suppository.  Pt required min A for bed mobility and tot A for pericare and pulling up pants.  Pt's Mom present to observe.  Discussed with Mom current level of assist for BADLs, as well as expected outcomes.  Informed Mom that pt presently has no interest in learning to [facility] and performing self-insertion of suppository.  Home measurement form given to Mom with explanation of purpose.  Mom stated she would bring it back within a day or two.  Pt continued to c/o of being "cold." RN checked temperature again and within normal ranges.  RN stated she will continue to monitor.    Therapy Documentation Precautions:  Precautions Precautions: Fall Required Braces or Orthoses: Spinal Brace Spinal Brace: Thoracolumbosacral orthotic, Applied in sitting position Restrictions Weight Bearing Restrictions: No    Vital Signs: Therapy Vitals Temp: 98.4 F (36.9 C) Temp Source: Oral Pulse Rate: (!) 108 BP: (!) 137/91 Patient Position (if appropriate): Lying Pain:  Pt c/o BLE pain, generalized discomfort,  and abdominal pain (incision); RN aware and repositioned   Other Treatments:    See Function Navigator for Current Functional Status.   Therapy/Group: Individual Therapy  [name], Nijah Orlich Chappell 02/14/2017, 9:58 AM

## 2017-02-15 ENCOUNTER — Inpatient Hospital Stay (HOSPITAL_COMMUNITY): Payer: Self-pay | Admitting: Physical Therapy

## 2017-02-15 ENCOUNTER — Inpatient Hospital Stay (HOSPITAL_COMMUNITY): Payer: Self-pay | Admitting: Occupational Therapy

## 2017-02-15 ENCOUNTER — Inpatient Hospital Stay (HOSPITAL_COMMUNITY): Payer: Self-pay
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
## 2017-02-15 NOTE — Progress Notes (Signed)
Occupational Therapy Session Note  Patient Details  Name: Craig Calderon P XXXRushing MRN: [id number] Date of Birth: 10/03/1990  Today's Date: 02/15/2017 OT Individual Time: 1000-1100 OT Individual Time Calculation (min): 60 min    Short Term Goals: Week 3:  OT Short Term Goal 1 (Week 3): STG=LTG 2/2 ELOS  Skilled Therapeutic Interventions/Progress Updates:    Pt resting in bed upon arrival.  Pt agreeable to bathing and changing clothing.  Pt declined bathing LB and indicated no interest in attempting.  Pt continues to state that his Mom, grandmother, and "home boys" will help with anything he can't or doesn't want to do.  Pt continued conversation throughout session regarding the amount of "help" that will be available when he goes home.  Pt appears to be content with level of lack of independence and confident that his family and "home boys" will provide necessary care, including toileting tasks.  Pt required min A to sit EOB and supervision for SBT to w/c.  Pt completed UB bathing and dressing tasks at setup level.  Pt remained in w/c with TLSO donned awaiting PT session.   Therapy Documentation Precautions:  Precautions Precautions: Fall Required Braces or Orthoses: Spinal Brace Spinal Brace: Thoracolumbosacral orthotic, Applied in sitting position Restrictions Weight Bearing Restrictions: No  Pain: Pain Assessment Pain Assessment: 0-10 Pain Score: 3  Pain Type: Acute pain;Surgical pain Pain Location: Abdomen Pain Descriptors / Indicators: Aching;Shooting Pain Frequency: Constant Pain Onset: On-going Patients Stated Pain Goal: 4 Pain Intervention(s): RN aware  See Function Navigator for Current Functional Status.   Therapy/Group: Individual Therapy  [name], Jarrette Dehner Chappell 02/15/2017, 12:01 PM

## 2017-02-15 NOTE — Progress Notes (Signed)
Physical Therapy Session Note  Patient Details  Name: Craig Calderon P XXXRushing MRN: [id number] Date of Birth: 10/25/1990  Today's Date: 02/15/2017 PT Individual Time: 1100-1200 PT Individual Time Calculation (min): 60 min   Short Term Goals: Week 3:  PT Short Term Goal 1 (Week 3): Pt will perform bed mobility with min A using leg loops.  PT Short Term Goal 2 (Week 3): Pt will demonstrate sitting tolerance in w/c for 2 hours with appropriate pressure relief.  PT Short Term Goal 3 (Week 3): Pt will direct assist needed for slide board transfer from bed<>w/c to family member with min A.  PT Short Term Goal 4 (Week 3): Pt will demonstrate LE stretching program with min A.   Skilled Therapeutic Interventions/Progress Updates:  Pt received seated in manual w/c at sink finishing washing his upper body. Set up A to donn t-shirt and mod A to donn spinal brace seated in w/c. Pt much more sociable this session, engaging in conversation during majority of session. Discussed with pt how he felt about family education yesterday; pt reports that his mom will be able to help him for 10-15 mins at a time and his grandmother will be there to help him the remainder of the time. Pt mentioned friends that can help him but states it is difficult for them to come to the hospital for hands-on training due to scheduling and lack of transportation. Pt propelled manual w/c >14250ft to ortho gym with S and no rest break. Pt educated on technique for ascending/descending ramp in w/c; pt negotiated ramp with close S. Pt propelled manual w/c back to pt's room with S. Pt able to correctly recall pressure relief technique and timing and performed w/c push-up x 35 seconds to demonstrate pressure relief and agreed to remain up in w/c to eat lunch. Pt left seated in manual w/c with all needs in reach.   Therapy Documentation Precautions:  Precautions Precautions: Fall Required Braces or Orthoses: Spinal Brace Spinal Brace:  Thoracolumbosacral orthotic, Applied in sitting position Restrictions Weight Bearing Restrictions: No   See Function Navigator for Current Functional Status.   Therapy/Group: Individual Therapy  Dorothea GlassmanCaroline Laurena Valko 02/15/2017, 12:34 PM

## 2017-02-15 NOTE — Progress Notes (Signed)
Occupational Therapy Session Note  Patient Details  Name: Craig Calderon MRN: [id number] Date of Birth: 12/18/1990  Today's Date: 02/15/2017 OT Individual Time: 1345-1455 OT Individual Time Calculation (min): 70 min    Short Term Goals: Week 3:  OT Short Term Goal 1 (Week 3): STG=LTG 2/2 ELOS  Skilled Therapeutic Interventions/Progress Updates:    Pt seen for OT session focusing on functional mobility and directing care. Pt in supine upon arrival, agreeable to tx session. He transferred to EOB with mod A, able to use leg loops to assist with advancing LEs off EOB. Pt able to direct therapist with proper set-up of w/c and sliding board in prep for transfer with min questioning cues. Throughout session, He completed sliding board transfer to w/c with min A to steady equipment and place board. Seated on EOM in therapy gym, completed x10 lateral leans to R and L with supervision and increased time with VCs for technique. Educated regarding functional implications of leans. Discussed d/c planning with pt, he voiced no concerns about planned d/c home next week. Discussed shower transfer as pt stated he was excited to shower at home. Verbalized through problem solving of tub bench transfer technique, pt displays decreased insight into challenge and set-up of transfer stating "it's just like all the other transfers" and could not grasp concept of LEs moving over tub wall. Therefore pt taken to ADL apartment and demonstration for technique of transfer performed and education provided regarding recommendations for set-up and sequencing of entire transfer task. Pt voiced understanding and appeared a little more willing to attempt transfer next session now that he has seen complexity of transfer. Pt returned to room at end of session, left in unit to propel w/c back mod I.    Therapy Documentation Precautions:  Precautions Precautions: Fall Required Braces or Orthoses: Spinal Brace Spinal Brace:  Thoracolumbosacral orthotic, Applied in sitting position Restrictions Weight Bearing Restrictions: No Pain:   No/ denies pain ADL: ADL ADL Comments: refer to functional navigator  See Function Navigator for Current Functional Status.   Therapy/Group: Individual Therapy  Lewis, Aniketh Huberty C 02/15/2017, 7:14 AM

## 2017-02-15 NOTE — Progress Notes (Signed)
Physical Therapy Session Note  Patient Details  Name: [name] P XXXRushing MRN: [id number] Date of Birth: 03/14/1991  Today's Date: 02/15/2017 PT Individual Time: [id number] PT Individual Time Calculation (min): 43 min   Short Term Goals: Week 3:  PT Short Term Goal 1 (Week 3): Pt will perform bed mobility with min A using leg loops.  PT Short Term Goal 2 (Week 3): Pt will demonstrate sitting tolerance in w/c for 2 hours with appropriate pressure relief.  PT Short Term Goal 3 (Week 3): Pt will direct assist needed for slide board transfer from bed<>w/c to family member with min A.  PT Short Term Goal 4 (Week 3): Pt will demonstrate LE stretching program with min A.    Skilled Therapeutic Interventions/Progress Updates:  Pt received sitting in WC and agreeable to PT. Pt reports that he is too fatigued and in too much pain to perform be car transfer or WC mobility, but was willing to work on bed mobility and sitting balance.   [facility] assist from PT to stabilize board and position BLE as pt progressed across SB to bed.   Dynamic Sitting balance EOB x 25 minutes to play Wii Resort. Pt required to reach outside BOS without UE support to complete game with moderate success. No LOB throughout game.    Bed mobility to return to supine with mod assist from PT to control BLE into bed. Pt left supine in bed      Therapy Documentation Precautions:  Precautions Precautions: Fall Required Braces or Orthoses: Spinal Brace Spinal Brace: Thoracolumbosacral orthotic, Applied in sitting position Restrictions Weight Bearing Restrictions: No    Vital Signs: Therapy Vitals Temp: 98.7 F (37.1 C) Temp Source: Oral Pulse Rate: 89 Resp: 17 BP: (!) 144/81 Patient Position (if appropriate): Lying Oxygen Therapy SpO2: 100 % O2 Device: Not Delivered Pain: Pain Assessment Pain Assessment: 0-10 Pain Score: 9  Pain Type: Surgical pain Pain Location: Abdomen Pain Descriptors  / Indicators: Aching Pain Frequency: Constant Pain Onset: On-going Patients Stated Pain Goal: 4 Pain Intervention(s): Medication (See eMAR)   See Function Navigator for Current Functional Status.   Therapy/Group: Individual Therapy  Golden Popustin E Brecklyn Galvis 02/15/2017, 5:31 PM

## 2017-02-15 NOTE — Progress Notes (Signed)
Social Work Patient ID: Craig Calderon, male   DOB: 09/19/1990, 26 y.o.   MRN: [id number]   CSW spoke with pt and his mother yesterday, while she was here for family education, to go over pt's DME at home and to educate them on home care.  Explained that whenever home therapists and nurses come out, they will only stay for their 45-60 minute visit and other care will fall to the family to provide.  Mother expressed understanding and CSW explained to pt this is why we want him to stay in the program until 02-23-17 so that he can be as independent as possible and not have to rely on his mother and grandmother so much for physical care.  He seemed to understand what CSW was saying, but did not verbalize it.  [name] is having ramp installed and CSW encouraged her to call people who are installing ramp and tell them his d/c date so that they will get it completed.  CS[facility] and provide him with [facility] closer to d/c.  Mother was appreciative.  CSW will continue to follow and assist as needed.

## 2017-02-16 ENCOUNTER — Inpatient Hospital Stay (HOSPITAL_COMMUNITY): Payer: Self-pay

## 2017-02-16 ENCOUNTER — Inpatient Hospital Stay (HOSPITAL_COMMUNITY): Payer: Self-pay | Admitting: Physical Therapy

## 2017-02-16 MED ORDER — METHOCARBAMOL 750 MG PO TABS
750.0000 mg | ORAL_TABLET | Freq: Four times a day (QID) | ORAL | Status: DC | PRN
  Administered 2017-02-16 – 2017-02-22 (×13): 750 mg via ORAL
  Filled 2017-02-16 (×13): qty 1

## 2017-02-16 NOTE — Progress Notes (Signed)
Pt. Repositioned and c/o pain to bilat legs and begins to moan. Rates pain at 10. VS: 98.6, 84, 126/64, 18. RN administered 15mg  oxycodone and administered suppository per md order. Nurse Tech noted dark colored urine in foley. Foley care performed this shift.

## 2017-02-16 NOTE — Progress Notes (Signed)
RN to reassess pt.'s pain. Pt sleeping at this time.

## 2017-02-16 NOTE — Progress Notes (Signed)
Occupational Therapy Session Note  Patient Details  Name: [name] P XXXRushing MRN: [id number] Date of Birth: 09/27/1990  Today's Date: 02/16/2017 OT Individual Time: 1545-1610 OT Individual Time Calculation (min): 25 min     Skilled Therapeutic Interventions/Progress Updates:    1:1. Pt complain of neuropathic pain in BLE. RN aware. Pt decline all OOB, EOB and long sitting activities stating, "I dont feel like doing it." Pt requires increased time to encourage to participate in tx. Pt rolls B with MOD A for LE management and advances theraband loop up/down hips with A from OT to maintain sidelying position. Pt requires increased time to initiate any movement. Pt declined donning leg loops to increase independence with bed mobility because it would make his legs hurt more.   Therapy Documentation Precautions:  Precautions Precautions: Fall Required Braces or Orthoses: Spinal Brace Spinal Brace: Thoracolumbosacral orthotic, Applied in sitting position Restrictions Weight Bearing Restrictions: No  See Function Navigator for Current Functional Status.   Therapy/Group: Individual Therapy  Shon HaleStephanie M Cray Monnin 02/16/2017, 5:05 PM

## 2017-02-16 NOTE — Progress Notes (Signed)
Occupational Therapy Session Note  Patient Details  Name: Craig Calderon MRN: [id number] Date of Birth: March 30, 1991  Today's Date: 02/16/2017 OT Individual Time: 0800-0900 OT Individual Time Calculation (min): 60 min    Short Term Goals: Week 2:  OT Short Term Goal 1 (Week 2): Pt will doff pants seated EOB/BSC with mod A for balance while utilizing lateral leans OT Short Term Goal 1 - Progress (Week 2): Progressing toward goal OT Short Term Goal 2 (Week 2): Pt will perform slideboard transfers to drop arm BSC with min A OT Short Term Goal 2 - Progress (Week 2): Progressing toward goal OT Short Term Goal 3 (Week 2): Pt will perform slideboard tub bench transfers with mod A OT Short Term Goal 3 - Progress (Week 2): Progressing toward goal OT Short Term Goal 4 (Week 2): Pt will perform LB dressing tasks with min a at bed level OT Short Term Goal 4 - Progress (Week 2): Met  Skilled Therapeutic Interventions/Progress Updates:    1;1. Pt c/o pain in BLE. RN administered medication. Pt refusing to participate in all LB self care 2/2 BLE pain. Pt rolls B with MOD A for OT to doff/don pants. Pt requires significantly increased time to initiate any functional mobility. Pt uses leg loops to manage BLE off bed with MIN A for RLE. PT requires MOD A overall for supine>sitting EOB. Pt declines directing transfer because, "It is annoying when you make me do that." Educated pt on importance of being able to tell family/friends how they can assist him especially if it is the first time. OT dons TLSO while pt seated EOB. Pt slide board transfer with MIN A when pants get stuck on board. Pt washes underarms and face at sink and dons pull over shirt with supervision. Exited session with pt seated in w/c with call light in reach and all needs met.   Therapy Documentation Precautions:  Precautions Precautions: Fall Required Braces or Orthoses: Spinal Brace Spinal Brace: Thoracolumbosacral orthotic, Applied in  sitting position Restrictions Weight Bearing Restrictions: No   Other Treatments:    See Function Navigator for Current Functional Status.   Therapy/Group: Individual Therapy  Tonny Branch 02/16/2017, 10:53 AM

## 2017-02-16 NOTE — Progress Notes (Signed)
Occupational Therapy Note  Patient Details  Name: Craig Calderon MRN: [id number] Date of Birth: 12/03/1990  Today's Date: 02/16/2017 OT Individual Time: 1300-1355 OT Individual Time Calculation (min): 55 min   Pt c/o BLE neuropathic pain and feeling cold; RN aware Vitals: BP 139/72 HR 108 O2 100% Temp 97.6 Individual Therapy  Pt resting in w/c upon arrival.  Pt declined bathing tasks and changing clothing.  Pt requested to return to bed to check if he had a bowel movement.  Pt required more than a reasonable amount of time to set up for SBT and complete transfer.  Pt rested X 2 while seated on SB.  Pt required assistance with positioning in bed to facilitate rolling in bed.   Pt declined getting OOB again stating that he didn't feel like it.  Craig Calderon, Craig Calderon [facility] Pa[facility]appell 02/16/2017, 2:38 PM

## 2017-02-16 NOTE — Progress Notes (Signed)
Physical Therapy Session Note  Patient Details  Name: Craig Calderon MRN: [id number] Date of Birth: 10/12/1990  Today's Date: 02/16/2017 PT Individual Time: 0930-1100 PT Individual Time Calculation (min): 90 min   Short Term Goals: Week 3:  PT Short Term Goal 1 (Week 3): Pt will perform bed mobility with min A using leg loops.  PT Short Term Goal 2 (Week 3): Pt will demonstrate sitting tolerance in w/c for 2 hours with appropriate pressure relief.  PT Short Term Goal 3 (Week 3): Pt will direct assist needed for slide board transfer from bed<>w/c to family member with min A.  PT Short Term Goal 4 (Week 3): Pt will demonstrate LE stretching program with min A.   Skilled Therapeutic Interventions/Progress Updates: Pt received seated in w/c; c/o pain as below and agreeable to treatment. W/c propulsion x25' with 11 propulsion required in that distance; no significant change in vitals during trial, pt rates exertion at 7/10. To be reassessed in ultra lightweight chair when available. Transferred back to bed after pt reports incontinent bowel movement. Transfer w/c <>bed min guard, assist for board placement and min cues for technique. Sit >supine maxA for time/energy conservation. Rolling R/L mod/maxA using bedrails to changed incontinent bowel movement totalA. Pants donned maxA with rolling. R sidelying>sit modA. ATP Josh Cadle present for w/c evaluation; pt vocally participated minimally however appears to be listening to all that therapist and ATP are talking, interjects appropriately with decision making process regarding various w/c features and options. Transferred to loaner manual chair as above. Demonstrates w/c propulsion in chair with S. Remained seated in w/c at end of session, all needs in reach.      Therapy Documentation Precautions:  Precautions Precautions: Fall Required Braces or Orthoses: Spinal Brace Spinal Brace: Thoracolumbosacral orthotic, Applied in sitting  position Restrictions Weight Bearing Restrictions: No Pain: Pain Assessment Pain Assessment: 0-10 Pain Score: 10-Worst pain ever Pain Type: Neuropathic pain Pain Location: Leg Pain Orientation: Right;Left Pain Descriptors / Indicators: Aching;Cramping;Pins and needles Pain Frequency: Constant Pain Onset: On-going Patients Stated Pain Goal: 4 Pain Intervention(s): Medication (See eMAR)   See Function Navigator for Current Functional Status.   Therapy/Group: Individual Therapy  Vista Lawmanlizabeth J Tygielski 02/16/2017, 11:55 AM
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
## 2017-02-17 ENCOUNTER — Ambulatory Visit (HOSPITAL_COMMUNITY): Payer: Self-pay | Admitting: Physical Therapy

## 2017-02-17 NOTE — Progress Notes (Signed)
Physical Therapy Session Note  Patient Details  Name: Craig Calderon MRN: [id number] Date of Birth: 07/22/1991  Today's Date: 02/17/2017 PT Individual Time: 1100-1215 PT Individual Time Calculation (min): 75 min   Short Term Goals: Week 3:  PT Short Term Goal 1 (Week 3): Pt will perform bed mobility with min A using leg loops.  PT Short Term Goal 2 (Week 3): Pt will demonstrate sitting tolerance in w/c for 2 hours with appropriate pressure relief.  PT Short Term Goal 3 (Week 3): Pt will direct assist needed for slide board transfer from bed<>w/c to family member with min A.  PT Short Term Goal 4 (Week 3): Pt will demonstrate LE stretching program with min A.   Skilled Therapeutic Interventions/Progress Updates: Pt received supine in bed, c/o pain as below and agreeable to treatment. Rolling R/L modA to check brief; does not require changing at this time. Sidelying>sit with modA and bedrails. Transfer to w/c with setupA and S overall. W/c management in community environment including uneven surfaces indoor/outdoors, on/off elevator, maneuvering around obstacles, driving up to water fountains. Performed car transfer to family car with setupA provided by mother with mod instructional cues from therapist and pt assisting with directing care. Mother helped place board and stabilized board during transfer, pt performed scoots with min guard. Discussed differences during transfer between current loaner chair and anticipated next loaner chair and permanent chair. Returned to room w/c propulsion S. Remained seated in w/c at end of session, all needs in reach.      Therapy Documentation Precautions:  Precautions Precautions: Fall Required Braces or Orthoses: Spinal Brace Spinal Brace: Thoracolumbosacral orthotic, Applied in sitting position Restrictions Weight Bearing Restrictions: No Pain: Pain Assessment Pain Assessment: 0-10 Pain Score: 10-Worst pain ever Pain Type: Acute pain;Neuropathic  pain Pain Location: Leg Pain Orientation: Right;Left Pain Descriptors / Indicators: Burning;Tingling Pain Frequency: Constant Pain Onset: On-going Pain Intervention(s): Medication (See eMAR)   See Function Navigator for Current Functional Status.   Therapy/Group: Individual Therapy  Vista Lawmanlizabeth J Tygielski 02/17/2017, 12:55 PM

## 2017-02-17 NOTE — Plan of Care (Signed)
Problem: RH PAIN MANAGEMENT Goal: RH STG PAIN MANAGED AT OR BELOW PT'S PAIN GOAL Lower than 5  Outcome: Not Progressing 7 is lowest score
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
## 2017-02-18 ENCOUNTER — Inpatient Hospital Stay (HOSPITAL_COMMUNITY): Payer: Self-pay | Admitting: Occupational Therapy
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
## 2017-02-18 NOTE — Progress Notes (Signed)
Occupational Therapy Session Note  Patient Details  Name: Craig Calderon MRN: [id number] Date of Birth: 02/16/1991  Today's Date: 02/18/2017 OT Individual Time: [id number] OT Individual Time Calculation (min): 40 min    Short Term Goals: Week 3:  OT Short Term Goal 1 (Week 3): STG=LTG 2/2 ELOS  Skilled Therapeutic Interventions/Progress Updates:    Pt seen for OT session focusing on bed mobility and education.  Therpaist arrived for scheduled tx session at 0800, spent 6 minutes educated and encouraging pt for OOB mobility, however, pt cont to decline stating he was too fatigued. He agreead to getting OOB if therapist returned later. Therapist returned at 0935 and with increased time and encouragement pt willing to participate. Asked to instruct therapist in steps for getting OOB in prep for directing care at d/c pt stated he first needed to be checked for incontinent BM. He was unable to direct therapist with how to assist with bed mobility and quickly became frustrated and annoyed. Educated regarding importance of directing caregivers as pt plans to have "homeboys" assist at d/c and no education has been provided to them.  He declined donning leg loops to assist with bed mobility and rolled with mod A. Pt having active BM upon checking brief, total A for hygiene and clothing management. RN performed dig.stim with education provided to pt regarding bowel program, pt states he understands but "it's not his fault it doesn't work".  Upon finishing hygiene and clothing management, pt declined OOB stating " I just have stuff on my mind". All education to pt regarding benefits of OOB and mobility already provided. Pt left in supine with all needs in reach.   Therapy Documentation Precautions:  Precautions Precautions: Fall Required Braces or Orthoses: Spinal Brace Spinal Brace: Thoracolumbosacral orthotic, Applied in sitting position Restrictions Weight Bearing Restrictions: No Pain: Pain  Assessment Pain Assessment: 0-10 Pain Score: 7  Pain Type: Acute pain Pain Location: Leg Pain Orientation: Right;Left Pain Descriptors / Indicators: Aching Pain Frequency: Intermittent Pain Onset: On-going Patients Stated Pain Goal: 3 Pain Intervention(s): Medication (See eMAR);Repositioned;Emotional support, RN aware ADL: ADL ADL Comments: refer to functional navigator  See Function Navigator for Current Functional Status.   Therapy/Group: Individual Therapy  Lewis, Zac Torti C 02/18/2017, 6:40 AM

## 2017-02-19 ENCOUNTER — Inpatient Hospital Stay (HOSPITAL_COMMUNITY): Payer: Medicaid Other | Admitting: Physical Therapy

## 2017-02-19 ENCOUNTER — Inpatient Hospital Stay (HOSPITAL_COMMUNITY): Payer: Medicaid Other | Admitting: *Deleted

## 2017-02-19 ENCOUNTER — Inpatient Hospital Stay (HOSPITAL_COMMUNITY): Payer: Self-pay

## 2017-02-19 LAB — CBC
HCT: 32.8 % — ABNORMAL LOW (ref 39.0–52.0)
Hemoglobin: 10.3 g/dL — ABNORMAL LOW (ref 13.0–17.0)
MCH: 27.2 pg (ref 26.0–34.0)
MCHC: 31.4 g/dL (ref 30.0–36.0)
MCV: 86.8 fL (ref 78.0–100.0)
PLATELETS: 634 10*3/uL — AB (ref 150–400)
RBC: 3.78 MIL/uL — ABNORMAL LOW (ref 4.22–5.81)
RDW: 15 % (ref 11.5–15.5)
WBC: 10.6 10*3/uL — ABNORMAL HIGH (ref 4.0–10.5)

## 2017-02-19 LAB — BASIC METABOLIC PANEL
Anion gap: 9 (ref 5–15)
BUN: 10 mg/dL (ref 6–20)
CALCIUM: 9.2 mg/dL (ref 8.9–10.3)
CO2: 25 mmol/L (ref 22–32)
CREATININE: 0.81 mg/dL (ref 0.61–1.24)
Chloride: 99 mmol/L — ABNORMAL LOW (ref 101–111)
GFR calc Af Amer: >60 mL/min (ref >60)
GFR calc non Af Amer: >60 mL/min (ref >60)
GLUCOSE: 88 mg/dL (ref 65–99)
Potassium: 4.2 mmol/L (ref 3.5–5.1)
Sodium: 133 mmol/L — ABNORMAL LOW (ref 135–145)

## 2017-02-19 MED ORDER — NORTRIPTYLINE HCL 25 MG PO CAPS
75.0000 mg | ORAL_CAPSULE | Freq: Every day | ORAL | Status: DC
  Administered 2017-02-19 – 2017-02-21 (×3): 75 mg via ORAL
  Filled 2017-02-19 (×3): qty 3

## 2017-02-19 NOTE — Plan of Care (Signed)
Pt's goals modified to mod A overall. Have d/c toileting and toilet transfer goals as pt refusing participation with toileting tasks. See POC for goal details. Johnsie CancelAmy Lewis, OTR/L 6/18

## 2017-02-19 NOTE — Progress Notes (Signed)
Physical Therapy Session Note  Patient Details  Name: Craig Calderon MRN: [id number] Date of Birth: 06/14/1991  Today's Date: 02/19/2017 PT Individual Time: 0800-0856 PT Individual Time Calculation (min): 56 min   Short Term Goals: Week 3:  PT Short Term Goal 1 (Week 3): Pt will perform bed mobility with min A using leg loops.  PT Short Term Goal 2 (Week 3): Pt will demonstrate sitting tolerance in w/c for 2 hours with appropriate pressure relief.  PT Short Term Goal 3 (Week 3): Pt will direct assist needed for slide board transfer from bed<>w/c to family member with min A.  PT Short Term Goal 4 (Week 3): Pt will demonstrate LE stretching program with min A.   Skilled Therapeutic Interventions/Progress Updates: Pt presented in bed with nsg present administering meds agreeable to therapy.  PTA donned TED hose and leg straps total assist. Pt performed supine to sit with use of leg straps modA with additional time with mod cues for use of straps. Pt limited by pain in BLE requiring frequent rest breaks. Max A donning TLSO. BS transfer to w/c with PTA providing set up for SB placement, transfer performed with supervision and additional time. MaxA leg rest placement with PTA cueing pt for use of leg lifter. Pt propelled throughout unit x 15 min with rest breaks as needed. Pt returned to room and remained in w/c with needs met.     Therapy Documentation Precautions:  Precautions Precautions: Fall Required Braces or Orthoses: Spinal Brace Spinal Brace: Thoracolumbosacral orthotic, Applied in sitting position Restrictions Weight Bearing Restrictions: No General:   Vital Signs: Therapy Vitals Temp: 98.4 F (36.9 C) Temp Source: Oral Pulse Rate: (!) 102 Resp: 16 BP: 115/62 Patient Position (if appropriate): Lying Oxygen Therapy SpO2: 100 % O2 Device: Not Delivered Pain: Pain Assessment Pain Assessment: 0-10 Pain Score: 10-Worst pain ever Pain Type: Neuropathic pain Pain Location:  Leg Pain Orientation: Right;Left Pain Descriptors / Indicators: Burning Pain Frequency: Constant Pain Onset: On-going Pain Intervention(s): Medication (See eMAR) Mobility:   Locomotion :    Trunk/Postural Assessment :    Balance:   Exercises:   Other Treatments:     See Function Navigator for Current Functional Status.   Therapy/Group: Individual Therapy    02/19/2017, 8:58 AM  
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
## 2017-02-19 NOTE — Plan of Care (Signed)
Problem: RH Bathing Goal: LTG Patient will bathe with assist, cues/equipment (OT) LTG: Patient will bathe specified number of body parts with assist with/without cues using equipment (position)  (OT)  Goal downgraded due to pt progress/ participation. Napoleon Form, OTR/L 6/18  Problem: RH Toileting Goal: LTG Patient will perform toileting w/assist, cues/equip (OT) LTG: Patient will perform toiletiing (clothes management/hygiene) with assist, with/without cues using equipment (OT)  Outcome: Not Applicable Date Met: 67/67/20 Goal d/c as pt refusing bowel program and toileting tasks. He is incontinent of bowel, using foley, and requires total A for management of all of the above. Pt voices no interest in education for independence with toileting tasks. Napoleon Form, OTR/L 6/18  Problem: RH Toilet Transfers Goal: LTG Patient will perform toilet transfers w/assist (OT) LTG: Patient will perform toilet transfers with assist, with/without cues using equipment (OT)  Outcome: Not Applicable Date Met: 94/70/96 Goal d/c as pt refuses to transfer to [facility] or toilet as part of bowel program. Napoleon Form, OTR/L 6/18  Problem: RH Tub/Shower Transfers Goal: LTG Patient will perform tub/shower transfers w/assist (OT) LTG: Patient will perform tub/shower transfers with assist, with/without cues using equipment (OT)  Goal downgraded due to pt progress. Napoleon Form, OTR/L 6/18

## 2017-02-19 NOTE — Progress Notes (Signed)
Physical Therapy Session Note  Patient Details  Name: Craig Calderon MRN: [id number] Date of Birth: 1990/12/15  Today's Date: 02/19/2017 PT Individual Time: 2130-8657 and 1350-1445 PT Individual Time Calculation (min): 65 min and    Short Term Goals: Week 2:  PT Short Term Goal 1 (Week 2): Pt will verbalize proper pressure relief technique and timing.  PT Short Term Goal 2 (Week 2): Pt will transfer from bed<>w/c S with minimal cuing.  PT Short Term Goal 3 (Week 2): Pt will perform bed mobility with min A using leg loops.  PT Short Term Goal 4 (Week 2): Pt will demonstrate sitting tolerance in w/c for 2 hours with appropriate pressure relief. PT Short Term Goal 5 (Week 2): Pt will propel manual w/c 133ft with S.  Week 3:  PT Short Term Goal 1 (Week 3): Pt will perform bed mobility with min A using leg loops.  PT Short Term Goal 2 (Week 3): Pt will demonstrate sitting tolerance in w/c for 2 hours with appropriate pressure relief.  PT Short Term Goal 3 (Week 3): Pt will direct assist needed for slide board transfer from bed<>w/c to family member with min A.  PT Short Term Goal 4 (Week 3): Pt will demonstrate LE stretching program with min A.   Skilled Therapeutic Interventions/Progress Updates:    Tx focused on directing self-case through functional mobility, pressure-relief program, and instruction in community mobility. Pt resting in bed, needing Min verbal cues to direct cares for equipment and set-up for brace, WC and transfers. Pt needed encouragement to attempt/perform tasks prior to asking for PT to assist.  Mod A for bed mobility, Min A sliding board transfer, plus assist for set-up. Pt encouraged to use leg loops throughout tx.  Pt propelled WC 3x200' in controlled and community settings, including Engineer, structural. Pt needed S on unit, but needed up to Mod A to control a difficult WC curb cut-out, navigating traffic, and uneven/inclined surfaces. Pt educated technique for  effectively controlling descent.  During rest breaks, pt educated on importance of healthy lifestyle, diet, and fitness following DC. Pt verbalized understanding without enthusiasm.  Pt performed rebounder with weighted blue ball x10 and with light ball for core strength. Pressure relief performed x3, but pt began feeling unwell, likely having BM. Pt mentioned that he has not been performing pressure relief frequently,a nd he was reminded of the implications of neglecting this task.  Pt strongly requested return to bed despite encouragement/education for remaining OOB. All needs in reach and NT aware of need to check brief.   Pt still in bed, refusing OOB txs today, but agreeable to modified bedside tx due to "not feeling right." VSS with HR at his usual elevated rate~100. RN came with pain meds.  Pt educated/reinformed information on pressure relief techniques, including frequenchy, options for performing, and reviewing handouts. Pt provided with cervical neck stretch HEP to perform.  Rolling R/L x3 each to check briefs and blocked practice with Mod A and cues for technique.  Supine bil LE stretching for ABD/ADD, heel cord, HS and hip rotation x38min each.  UE therex for strengthening with green TB: rowing, bicep curls, lat pull downs bil x10 each with cues for eccecntric strengthening.  Discussed and provided emotional support surrounding healthy lifestyle changes and developing a routine at home with family. Pt in better mood by end of tx, up eating lunch.   Therapy Documentation Precautions:  Precautions Precautions: Fall Required Braces or Orthoses: Spinal Brace Spinal Brace:  Thoracolumbosacral orthotic, Applied in sitting position Restrictions Weight Bearing Restrictions: No Pain: Pain Assessment Pain Score: 7 in bil LEs. Modified tx and RN aware.    See Function Navigator for Current Functional Status.   Therapy/Group: Individual Therapy  Nyia Tsao Virl CageyM  Kypton Eltringham, PT,  DPT  02/19/2017, 12:06 PM

## 2017-02-19 NOTE — Progress Notes (Signed)
Occupational Therapy Session Note  Patient Details  Name: Craig Calderon MRN: [id number] Date of Birth: 03/02/1991  Today's Date: 02/19/2017 OT Individual Time: 1000-1100 OT Individual Time Calculation (min): 60 min    Short Term Goals: Week 3:  OT Short Term Goal 1 (Week 3): STG=LTG 2/2 ELOS  Skilled Therapeutic Interventions/Progress Updates:    Pt resting in w/c upon arrival and requested to change shirt and bathe UB.  Pt completed UB bathing/dressing with setup, requiring assistance with doffing/donning TLSO.  Pt agreeable to practicing tub bench transfer in ADL apartment.  Pt completed transfer with assistance to placing BLE in/out of tub.  Pt requested to return to room to change brief.  Pt did not assist with LB clothing management or hygiene, stating that he has "help" at home from [place], Grandmother, and "home boys." Continued ongoing education regarding pt's potential to complete all self care tasks without assistance but pt insistent that he would have "plenty of help." Pt remained in bed with all needs within reach.   Therapy Documentation Precautions:  Precautions Precautions: Fall Required Braces or Orthoses: Spinal Brace Spinal Brace: Thoracolumbosacral orthotic, Applied in sitting position Restrictions Weight Bearing Restrictions: No  Pain: Pain Assessment Pain Score: 7  BLE, RN aware   See Function Navigator for Current Functional Status.   Therapy/Group: Individual Therapy  [name], Odeal Welden Chappell 02/19/2017, 11:59 AM

## 2017-02-20 ENCOUNTER — Inpatient Hospital Stay (HOSPITAL_COMMUNITY): Payer: Self-pay | Admitting: Physical Therapy

## 2017-02-20 ENCOUNTER — Inpatient Hospital Stay (HOSPITAL_COMMUNITY): Payer: Self-pay

## 2017-02-20 ENCOUNTER — Encounter (HOSPITAL_COMMUNITY): Payer: Self-pay | Admitting: Psychology

## 2017-02-20 MED ORDER — CEPHALEXIN 250 MG PO CAPS
500.0000 mg | ORAL_CAPSULE | Freq: Three times a day (TID) | ORAL | Status: DC
  Administered 2017-02-20 – 2017-02-22 (×7): 500 mg via ORAL
  Filled 2017-02-20 (×6): qty 2

## 2017-02-20 NOTE — Progress Notes (Signed)
Physical Therapy Session Note  Patient Details  Name: MARKEIS ALLMAN MRN: [id number] Date of Birth: November 01, 1990  Today's Date: 02/20/2017 PT Individual Time: 5102-5852 PT Individual Time Calculation (min): 26 min   Short Term Goals: Week 3:  PT Short Term Goal 1 (Week 3): Pt will perform bed mobility with min A using leg loops.  PT Short Term Goal 2 (Week 3): Pt will demonstrate sitting tolerance in w/c for 2 hours with appropriate pressure relief.  PT Short Term Goal 3 (Week 3): Pt will direct assist needed for slide board transfer from bed<>w/c to family member with min A.  PT Short Term Goal 4 (Week 3): Pt will demonstrate LE stretching program with min A.   Skilled Therapeutic Interventions/Progress Updates: Pt presented in w/c agreeable to therapy. Propelled to rehab gym to perform LE stretches, pt encouraged to use leg loops to lift from leg rests. Pt indicating increased legs and BLE and refused SB transfer to mat. Pt agreeable to continued propulsion and performed x 1 ascent/descent on ramp. Pt returned to room and remained in w/c with all current needs met.      Therapy Documentation Precautions:  Precautions Precautions: Fall Required Braces or Orthoses: Spinal Brace Spinal Brace: Thoracolumbosacral orthotic, Applied in sitting position Restrictions Weight Bearing Restrictions: No General:   Vital Signs:  Pain: Pain Assessment Pain Score: 0-No pain   See Function Navigator for Current Functional Status.   Therapy/Group: Individual Therapy  Ruthanne Mcneish  Romaine Maciolek, PTA  02/20/2017, 10:30 AM

## 2017-02-20 NOTE — Progress Notes (Signed)
Occupational Therapy Session Note  Patient Details  Name: Craig Calderon MRN: [id number] Date of Birth: 08/29/1991  Today's Date: 02/20/2017 OT Individual Time: 1000-1100 OT Individual Time Calculation (min): 60 min    Short Term Goals: Week 3:  OT Short Term Goal 1 (Week 3): STG=LTG 2/2 ELOS  Skilled Therapeutic Interventions/Progress Updates:    Pt resting in w/c upon arrival.  Pt stated he felt nauseous and needed to "have his brief checked."  Requested pt to setup w/c for transfer but pt stated he wasn't able to.  Pt required tot A for w/c placement and placement of slide board in preparation for transfer.  Pt required min A for transfer this morning.  Pt required min A for rolling in bed to facilitate toileting tasks and changing of brief.  Pt incontinent of bowel.  RN assisted.  Pt c/o feeling cold and hot.  Attempted to continue education on importance of directing care but pt showed no interest in engaging in conversation.  Pt remained in bed with all needs within reach.   Therapy Documentation Precautions:  Precautions Precautions: Fall Required Braces or Orthoses: Spinal Brace Spinal Brace: Thoracolumbosacral orthotic, Applied in sitting position Restrictions Weight Bearing Restrictions: No Pain:  Pt c/o general malaise; RN aware and attended to  See Function Navigator for Current Functional Status.   Therapy/Group: Individual Therapy  [name], Casey Fye Chappell 02/20/2017, 2:45 PM
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
## 2017-02-20 NOTE — Progress Notes (Signed)
Physical Therapy Session Note  Patient Details  Name: Craig Calderon P XXXRushing MRN: [id number] Date of Birth: 07/16/1991  Today's Date: 02/20/2017 PT Individual Time: [id number] PT Individual Time Calculation (min): 30 min     Skilled Therapeutic Interventions/Progress Updates:    Pt reports 10/10 pain upon therapist entering the room - reports that he just had pain medications.  Pt agreeable to working with therapist.  Session focused on decreasing burden of care for D/C home.  Pt mother drained cath bag prior to start of therapy and depends checked for BM - none noted.  Pt transferred supine to sit with moderate assistance with HOB elevated (donned TLSO in seated) and mat to chair with sliding board with supervision to min assist (requires assist for positioning of B LE).  Following session, pt left up in chair with seat belt donned and nursing notified of pt location.    Therapy Documentation Precautions:  Precautions Precautions: Fall Required Braces or Orthoses: Spinal Brace Spinal Brace: Thoracolumbosacral orthotic, Applied in sitting position Restrictions Weight Bearing Restrictions: No   See Function Navigator for Current Functional Status.   Therapy/Group: Individual Therapy  Michel Eskelson Elveria RisingM Yousra Ivens 02/20/2017, 3:40 PM

## 2017-02-20 NOTE — Progress Notes (Signed)
Physical Therapy Session Note  Patient Details  Name: Craig Calderon MRN: [id number] Date of Birth: [birth year]  Today's Date: 02/20/2017 PT Individual Time: 0800-0900 PT Individual Time Calculation (min): 60 min   Short Term Goals: Week 3:  PT Short Term Goal 1 (Week 3): Pt will perform bed mobility with min A using leg loops.  PT Short Term Goal 2 (Week 3): Pt will demonstrate sitting tolerance in w/c for 2 hours with appropriate pressure relief.  PT Short Term Goal 3 (Week 3): Pt will direct assist needed for slide board transfer from bed<>w/c to family member with min A.  PT Short Term Goal 4 (Week 3): Pt will demonstrate LE stretching program with min A.   Skilled Therapeutic Interventions/Progress Updates:  Tx 1: Pt received asleep supine in bed. Pt woken up easily and agreeable to get out of bed. PRAFO boots doffed and leg loops donned. Pt min A rolling R in order for PT to check brief to assess need for changing. Pt mod A supine>sit with use of leg loops and pt directing assistance needed. Pt min A to scoot toward EOB and perform slide board transfer from bed>w/c, including placement of slide board. Pt first attempted slightly uphill transfer with some difficulty before cued to raise the bed to make transfer easier. Once in manual w/c, pt requesting to eat breakfast. Pt maneuvered w/c to sink and performed grooming tasks with setup assist. Pt left seated in manual w/c with all needs met.   Tx 2: Pt received supine in bed with pt reporting nausea and requesting to stay in bed. Pt declined therapy at this time. Pt missed 60 minutes of therapy time due to pt not feeling well and unwilling to participate.   Therapy Documentation Precautions:  Precautions Precautions: Fall Required Braces or Orthoses: Spinal Brace Spinal Brace: Thoracolumbosacral orthotic, Applied in sitting position Restrictions Weight Bearing Restrictions: No General: PT Amount of Missed Time (min): 60 Minutes PT  Missed Treatment Reason: Patient ill (Comment);Patient unwilling to participate   See Function Navigator for Current Functional Status.   Therapy/Group: Individual Therapy  Alysia Penna 02/20/2017, 11:10 AM

## 2017-02-21 ENCOUNTER — Inpatient Hospital Stay (HOSPITAL_COMMUNITY): Payer: Self-pay | Admitting: Physical Therapy

## 2017-02-21 ENCOUNTER — Inpatient Hospital Stay (HOSPITAL_COMMUNITY): Payer: Self-pay

## 2017-02-21 MED ORDER — BISACODYL 10 MG RE SUPP: 10.0000 mg | Freq: Every day | RECTAL | 0 refills | Status: DC

## 2017-02-21 MED ORDER — OXYCODONE HCL 5 MG PO TABS
10.0000 mg | ORAL_TABLET | Freq: Four times a day (QID) | ORAL | Status: DC | PRN
  Administered 2017-02-21 – 2017-02-22 (×3): 10 mg via ORAL
  Filled 2017-02-21 (×3): qty 2

## 2017-02-21 MED ORDER — METHOCARBAMOL 750 MG PO TABS: 750.0000 mg | ORAL_TABLET | Freq: Four times a day (QID) | ORAL | 0 refills | Status: DC | PRN

## 2017-02-21 MED ORDER — ENOXAPARIN (LOVENOX) PATIENT EDUCATION KIT
PACK | Freq: Once | Status: DC
  Filled 2017-02-21: qty 1

## 2017-02-21 MED ORDER — ENOXAPARIN (LOVENOX) PATIENT EDUCATION KIT: PACK | Freq: Once | Status: DC

## 2017-02-21 MED ORDER — ENOXAPARIN SODIUM 30 MG/0.3ML ~~LOC~~ SOLN: 30.0000 mg | Freq: Two times a day (BID) | SUBCUTANEOUS | 1 refills | Status: DC

## 2017-02-21 MED ORDER — CEPHALEXIN 500 MG PO CAPS: 500.0000 mg | ORAL_CAPSULE | Freq: Three times a day (TID) | ORAL | 0 refills | Status: DC

## 2017-02-21 MED ORDER — NORTRIPTYLINE HCL 75 MG PO CAPS: 75.0000 mg | ORAL_CAPSULE | Freq: Every day | ORAL | 1 refills | Status: DC

## 2017-02-21 MED ORDER — SENNA 8.6 MG PO TABS: 2.0000 | ORAL_TABLET | Freq: Two times a day (BID) | ORAL | 0 refills | Status: AC

## 2017-02-21 MED ORDER — PANTOPRAZOLE SODIUM 40 MG PO TBEC: 40.0000 mg | DELAYED_RELEASE_TABLET | Freq: Every day | ORAL | 0 refills | Status: DC

## 2017-02-21 MED ORDER — OXYCODONE HCL 10 MG PO TABS: 10.0000 mg | ORAL_TABLET | Freq: Four times a day (QID) | ORAL | 0 refills | Status: DC | PRN

## 2017-02-21 MED ORDER — LEVETIRACETAM 500 MG PO TABS: 500.0000 mg | ORAL_TABLET | Freq: Two times a day (BID) | ORAL | 0 refills | Status: DC

## 2017-02-21 NOTE — Progress Notes (Signed)
Physical Therapy Discharge Summary  Patient Details  Name: Craig Calderon MRN: [id number] Date of Birth: 04-10-1991  Today's Date: 02/21/2017 PT Individual Time: 1100-1210 PT Individual Time Calculation (min): 70 min    Patient has met 4 of 7 long term goals due to improved activity tolerance, improved balance, improved postural control, increased strength and ability to compensate for deficits.  Patient to discharge at a wheelchair level [place].  Patient's care partner is independent to provide the necessary physical assistance at discharge.  Reasons goals not met: 3/7 goals adequate for discharge. Pt and family have demonstrated   Recommendation:  Patient will benefit from ongoing skilled PT services in home health setting to continue to advance safe functional mobility, address ongoing impairments in bed mobility, transfers, UE strengthening, seated postural control, and minimize fall risk.  Equipment: slide board, hospital bed, manual w/c  Reasons for discharge: treatment goals met and goals adequate for discharge  Patient/family agrees with progress made and goals achieved: Yes  PT Discharge Precautions/Restrictions Precautions Precautions: Back Required Braces or Orthoses: Spinal Brace Spinal Brace: Thoracolumbosacral orthotic Restrictions Weight Bearing Restrictions: No Pain Pain Assessment Pain Assessment: 0-10 Pain Score: 10-Worst pain ever Pain Type: Acute pain Pain Location: Leg Pain Orientation: Right;Left Pain Descriptors / Indicators: Aching Pain Frequency: Intermittent Pain Onset: Gradual Patients Stated Pain Goal: 2 Pain Intervention(s): Medication (See eMAR) Vision/Perception  Perception Perception: Within Functional Limits Praxis Praxis: Intact  Cognition Orientation Level: Oriented X4 Sensation Sensation Light Touch: Impaired by gross assessment (Pt reports he has some sensation when donning L TED hose) Hot/Cold: Impaired by gross  assessment Proprioception: Impaired by gross assessment Additional Comments: LB sensation impairments below T12, UB intact  Coordination Gross Motor Movements are Fluid and Coordinated: No Fine Motor Movements are Fluid and Coordinated: Yes (WFL in upper body only) Coordination and Movement Description: UB functional, LB no active movement Motor  Motor Motor: Paraplegia  Mobility Bed Mobility Rolling Right: 3: Mod assist Rolling Right Details: Tactile cues for initiation;Tactile cues for weight shifting;Manual facilitation for weight shifting;Manual facilitation for placement Rolling Left: 3: Mod assist Rolling Left Details: Tactile cues for initiation;Tactile cues for weight shifting;Manual facilitation for placement;Manual facilitation for weight shifting;Tactile cues for sequencing Supine to Sit: 3: Mod assist (with use of leg loops) Supine to Sit Details: Tactile cues for initiation;Tactile cues for sequencing;Tactile cues for weight shifting;Verbal cues for sequencing;Manual facilitation for weight shifting;Manual facilitation for placement Sit to Supine: 3: Mod assist Sit to Supine - Details: Tactile cues for initiation;Tactile cues for weight shifting;Manual facilitation for weight shifting;Manual facilitation for placement Transfers Transfers: Yes Lateral/Scoot Transfers: 4: Min assist (placement of slide board) Lateral/Scoot Transfer Details: Manual facilitation for placement Locomotion  Ambulation Ambulation: No  Trunk/Postural Assessment  Cervical Assessment Cervical Assessment: Within Functional Limits Thoracic Assessment Thoracic Assessment: Exceptions to [facility] (limited evaluation due to TLSO) Lumbar Assessment Lumbar Assessment: Exceptions to [facility] (limited evaluation due to TLSO) Postural Control Postural Control: Deficits on evaluation (requires single UE support when seated on EOB)  Balance Balance Balance Assessed: Yes Static Sitting Balance Static Sitting -  Level of Assistance: 6: Modified independent (Device/Increase time) Dynamic Sitting Balance Dynamic Sitting - Level of Assistance: 5: Stand by assistance Extremity Assessment  RUE Assessment RUE Assessment: Within Functional Limits LUE Assessment LUE Assessment: Within Functional Limits RLE Assessment RLE Assessment: Exceptions to [facility] (grossly 0/5 throughout LE; ROM WFL for mobility tasks) LLE Assessment LLE Assessment: Exceptions to [facility] (Grossly 0/5 LE strength; ROM WFL for mobility tasks)  Pt received supine bed and agreeable to treatment. Pt mod A supine>sit on EOB using leg loops with pt directing assistance needed. Min A slide board transfer from bed>w/c for placement and removal of slide board. Pt propelled manual w/c >14f to ortho gym. Car transfer with min A for placement of slide board and minimal assistance to lift LEs into car; pt only required guidance of LEs to move LEs out of car. Pt reporting he had a bowel movement and requesting to be pushed back to room. Total A back to pt's room. Min A slide board transfer w/c>bed. Mod A sit>supine to lift B LEs. Scooting toward EOB with min A and verbal cues for technique. Min/mod A rolling R/L with  total A for peri hygiene and to change brief. Pt left supine in bed with all needs met.    See Function Navigator for Current Functional Status.  CAlysia Penna6/20/2018, 1:43 PM

## 2017-02-21 NOTE — Plan of Care (Signed)
Problem: RH Dressing Goal: LTG Patient will perform lower body dressing w/assist (OT) LTG: Patient will perform lower body dressing with assist, with/without cues in positioning using equipment (OT)  Outcome: Not Met (add Reason) Pt refusing LB dressing throughout admission. Napoleon Form, OTR/L 6/20

## 2017-02-21 NOTE — Progress Notes (Signed)
Occupational Therapy Discharge Summary  Patient Details  Name: Craig Calderon MRN: [id number] Date of Birth: Aug 28, 1991   Patient has met 3 of 4 long term goals due to improved activity tolerance, improved balance, postural control, ability to compensate for deficits and improved coordination.  Pt progressed steadily with UB bathing/dressing tasks during this admission.  Pt has continually declined BSC transfers and prefers to have bowel movements while in bed.  Pt has been reluctant to consistently participate in regular bowel program.  Pt has been educated numerous times regarding importance of bowel program and consequences if not followed.  Pt does not respond to education.  Pt declined to participate and learn techniques for LB bathing/dressing at bed level.  Pt persistently states that his Mom and his "home boys" will assist with these tasks, including hygiene after bowel movement.  Pt's affect has been flat througout admission.  Pt required max encouragement to participate in therapies and would occasionally refuse treatment.  Pt anxious to get home because "everything will be better at home." Pt's Mom has been present for OT session X 1. Patient to discharge at [facility] Max Assist level.  Patient's care partner reports being able and willing to provide the necessary physical assistance at discharge.    Reasons goals not met: Pt did not meet LB dressing goal due to refusal to participate in task during IPR admission  Recommendation:  Patient will benefit from ongoing skilled OT services in home health setting to continue to advance functional skills in the area of BADL and Reduce care partner burden.  Equipment: tub transfer bench  Reasons for discharge: treatment goals met and discharge from hospital  Patient/family agrees with progress made and goals achieved: Yes  OT Discharge Vision Baseline Vision/History: No visual deficits Patient Visual Report: No change from baseline Vision  Assessment?: No apparent visual deficits Perception  Perception: Within Functional Limits Praxis Praxis: Intact Cognition Overall Cognitive Status: Within Functional Limits for tasks assessed Arousal/Alertness: Awake/alert Orientation Level: Oriented X4 Attention: Sustained Sustained Attention: Appears intact Memory: Appears intact Awareness: Appears intact Problem Solving: Impaired Problem Solving Impairment: Functional basic Behaviors: Other (comment) (flat affect) Safety/Judgment: Appears intact Sensation Sensation Light Touch: Impaired by gross assessment (BUE intact) Stereognosis: Appears Intact Hot/Cold: Appears Intact (BUE intact) Proprioception: Appears Intact (BUE intact) Additional Comments: LB sensation impairments below T12, UB intact  Coordination Gross Motor Movements are Fluid and Coordinated: Yes (BUE) Fine Motor Movements are Fluid and Coordinated: Yes (BUE) Coordination and Movement Description: UB functional, LB no active movement Motor  Motor Motor: Paraplegia Mobility  Bed Mobility Rolling Right: 3: Mod assist Rolling Right Details: Tactile cues for initiation;Tactile cues for weight shifting;Manual facilitation for weight shifting;Manual facilitation for placement Rolling Left: 3: Mod assist Rolling Left Details: Tactile cues for initiation;Tactile cues for weight shifting;Manual facilitation for placement;Manual facilitation for weight shifting;Tactile cues for sequencing Supine to Sit: 3: Mod assist (with use of leg loops) Supine to Sit Details: Tactile cues for initiation;Tactile cues for sequencing;Tactile cues for weight shifting;Verbal cues for sequencing;Manual facilitation for weight shifting;Manual facilitation for placement Sit to Supine: 3: Mod assist Sit to Supine - Details: Tactile cues for initiation;Tactile cues for weight shifting;Manual facilitation for weight shifting;Manual facilitation for placement  Trunk/Postural Assessment   Cervical Assessment Cervical Assessment: Within Functional Limits Thoracic Assessment Thoracic Assessment: Exceptions to [facility] (limited by TLSO) Lumbar Assessment Lumbar Assessment: Exceptions to [facility] (limited by TLSO) Postural Control Postural Control: Deficits on evaluation (requires UE support)  Balance Balance Balance Assessed:  Yes Static Sitting Balance Static Sitting - Level of Assistance: 6: Modified independent (Device/Increase time) Dynamic Sitting Balance Dynamic Sitting - Level of Assistance: 5: Stand by assistance Extremity/Trunk Assessment RUE Assessment RUE Assessment: Within Functional Limits LUE Assessment LUE Assessment: Within Functional Limits   See Function Navigator for Current Functional Status.  Leotis Shames [facility] 02/21/2017, 2:56 PM

## 2017-02-21 NOTE — Progress Notes (Signed)
Occupational Therapy Note  Patient Details  Name: Craig Calderon P XXXRushing MRN: [id number] Date of Birth: 06/29/1991  Today's Date: 02/21/2017 OT Individual Time: 1330-1400 OT Individual Time Calculation (min): 30 min   Pt c/o 10/10 BLE pain; meds admin earlier but pt stated that "they don't help" Individual Therapy  Pt resting in bed upon arrival.  Pt stated that his legs hurt and he didn't feel like getting OOB. Continued education regarding OOB activity at discharge and importance of repositioning and physical activity to reduce risk of pressure ulcers.  Pt did not respond to education attempts.  Pt continues to assert that his "home boys" will help him at home and he doesn't see the need to [facility] or learn to perform toileting tasks or LB dressing tasks.  Pt remained in bed with all needs within reach.  Pt missed 30 mins skilled OT services 2/2 pt decline active participation in therapy.    Lavone NeriLanier, Sandar Krinke [facility] 02/21/2017, 2:08 PM

## 2017-02-21 NOTE — Progress Notes (Signed)
Physical Therapy Note  Patient Details  Name: [name] P XXXRushing MRN: [id number] Date of Birth: 09/28/1990 Today's Date: 02/21/2017    Pt resting in bed, reports fatigue from full day of therapies and wanting to rest up for d/c tomorrow.  Pt declining out of bed mobility at this time.     Lyrick Worland E Penven-Crew 02/21/2017, 4:41 PM

## 2017-02-21 NOTE — Plan of Care (Signed)
Problem: SCI BOWEL ELIMINATION Goal: RH STG SCI MANAGE BOWEL WITH MEDICATION WITH ASSISTANCE STG SCI Manage bowel with medication with assistance. Mod  Outcome: Not Met (add Reason) Patient refuses interventions at times, refuses laxatives & is total assist  Problem: RH PAIN MANAGEMENT Goal: RH STG PAIN MANAGED AT OR BELOW PT'S PAIN GOAL Lower than 5  Outcome: Not Met (add Reason) Patient c/o pain scale  Out of 10 to BLE.  Comments: Patient refuses interventions at times, refuses laxatives & is total assist. He c/o pain 10 out of 10 on the pain scale.

## 2017-02-21 NOTE — Progress Notes (Signed)
Physical Therapy Session Note  Patient Details  Name: Craig Calderon MRN: [id number] Date of Birth: 08/24/1991  Today's Date: 02/21/2017 PT Individual Time: 1300-1330 PT Individual Time Calculation (min): 30 min   Short Term Goals: Week 3:  PT Short Term Goal 1 (Week 3): Pt will perform bed mobility with min A using leg loops.  PT Short Term Goal 2 (Week 3): Pt will demonstrate sitting tolerance in w/c for 2 hours with appropriate pressure relief.  PT Short Term Goal 3 (Week 3): Pt will direct assist needed for slide board transfer from bed<>w/c to family member with min A.  PT Short Term Goal 4 (Week 3): Pt will demonstrate LE stretching program with min A.   Skilled Therapeutic Interventions/Progress Updates:    Pt in bed upon arrival, refusing any out of bed activity at this time. Reviewing pressure relief techniques with pt. Pt educated on w/c management with stairs. Discussed single curb and multiple steps. Pt able to verbalize understanding by end of session and denied any questions. Handout reviewed as well. Pt in bed with all needs in reach.   Therapy Documentation Precautions:  Precautions Precautions: Back Required Braces or Orthoses: Spinal Brace Spinal Brace: Thoracolumbosacral orthotic Restrictions Weight Bearing Restrictions: No Pain: Pt reports having pain in LEs, receiving pain medication during session.   See Function Navigator for Current Functional Status.   Therapy/Group: Individual Therapy  Delton SeeBenjamin Njeri Vicente, PT 02/21/2017, 2:28 PM
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
## 2017-02-21 NOTE — Progress Notes (Signed)
Occupational Therapy Session Note  Patient Details  Name: Craig Calderon MRN: [id number] Date of Birth: 03/29/1991  Today's Date: 02/21/2017 OT Individual Time: [id number] OT Individual Time Calculation (min): 55 min    Short Term Goals: Week 3:  OT Short Term Goal 1 (Week 3): STG=LTG 2/2 ELOS  Skilled Therapeutic Interventions/Progress Updates:    Pt resting in bed upon arrival finishing breakfast.  Pt initially stated he couldn't get OOB because his "stomach was too ful." Discussed with patient that he wouldn't have opportunity for a couple of hours if he refused at this time.  Pt reluctantly agreed to engaged in BADLs.  Pt required assistance to bathe buttocks and B lower legs and thread pants.  Pt required mod A for supine>sit EOB after moving BLE over EOB.  Pt required min A for slide board transfer this morning.  Pt completed UB bathing/dressing tasks at sink with assistance to doff/don TLSO. Continued discharge planning and education regarding directing care, pressure relief, time spent OOB, and active participation in BADLs.  Reeducated importance of bowel program and possible consequences if not followed.  Pt did not respond to education.   Therapy Documentation Precautions:  Precautions Precautions: Fall Required Braces or Orthoses: Spinal Brace Spinal Brace: Thoracolumbosacral orthotic, Applied in sitting position Restrictions Weight Bearing Restrictions: No  Pain: Pain Assessment Pain Assessment: 0-10 Pain Score: 4  Pain Type: Surgical pain Pain Location: Abdomen Pain Orientation: Lower Pain Descriptors / Indicators: Aching Pain Frequency: Intermittent Pain Onset: Gradual Patients Stated Pain Goal: 2 Pain Intervention(s):RN aware  See Function Navigator for Current Functional Status.   Therapy/Group: Individual Therapy  [name], Cory Kitt Chappell 02/21/2017, 12:03 PM

## 2017-02-22 ENCOUNTER — Encounter (HOSPITAL_COMMUNITY): Payer: Self-pay | Admitting: Emergency Medicine

## 2017-02-22 MED FILL — PANTOPRAZOLE SOD DR 40 MG T: 40 | 30 days supply | Qty: 30 | Fill #0

## 2017-02-22 MED FILL — oxyCODONE HCL 10 MG TABS: 10 | 7 days supply | Qty: 30 | Fill #0

## 2017-02-22 MED FILL — CEPHALEXIN 500 MG CAPSULE: 500 | 5 days supply | Qty: 17 | Fill #0

## 2017-02-22 MED FILL — levETIRAcetam 500 MG TABS: 500 | 30 days supply | Qty: 60 | Fill #0

## 2017-02-22 MED FILL — ENOXAPARIN 30 MG/0.3 ML SYR: 30 | 34 days supply | Qty: 20 | Fill #0

## 2017-02-22 MED FILL — NORTRIPTYLINE HCL 75 MG CAP: 75 | 30 days supply | Qty: 30 | Fill #0

## 2017-02-22 MED FILL — METHOCARBAMOL 750 MG TABLET: 750 | 15 days supply | Qty: 60 | Fill #0
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
## 2017-02-22 NOTE — Discharge Instructions (Signed)
Inpatient Rehab Discharge Instructions  Craig Calderon P XXXRushing Discharge date and time:    Activities/Precautions/ Functional Status: Activity: no lifting, driving, or strenuous exercise  till cleared by MD Diet: regular diet Wound Care: keep wound clean and dry   Functional status:  ___ No restrictions     ___ Walk up steps independently ___ 24/7 supervision/assistance   ___ Walk up steps with assistance ___ Intermittent supervision/assistance  ___ Bathe/dress independently ___ Walk with walker     ___ Bathe/dress with assistance ___ Walk Independently    ___ Shower independently ___ Walk with assistance    ___ Shower with assistance ___ No alcohol     ___ Return to work/school ________  COMMUNITY REFERRALS UPON DISCHARGE:   Home Health:   PT     OT      RN    SW    Agency:  [facility] Phone:  [phone] Medical Equipment/Items Ordered:  Hospital bed; tub bench; 30" slide board; wheelchair on order - you have a loaner  Agency/Supplier:  [facility]     Phone:  [phone] Other:  Dr. Arley PhenixJohn Calderon - Neuropsychologist you saw on Rehab is at Dr. Rosalyn ChartersSwartz's office  [phone]  Special Instructions: CONTINUE LOVENOX 30 MG TWICE DAILY to be completed 04/23/2017   My questions have been answered and I understand these instructions. I will adhere to these goals and the provided educational materials after my discharge from the hospital.  Patient/Caregiver Signature _______________________________ Date __________  Clinician Signature _______________________________________ Date __________  Please bring this form and your medication list with you to all your follow-up doctor's appointments.

## 2017-02-22 NOTE — Progress Notes (Addendum)
Patient and family dicussed all the discharged instructions with PA,and Child psychotherapistsocial worker.Also , I educated family about abdomen incision care, foley care,lovenox administration, and sacrum fissura  dressing change.
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
## 2017-02-22 NOTE — Progress Notes (Signed)
Social Work Discharge Note  The overall goal for the admission was met for:   Discharge location: Yes - home  Length of Stay: Yes - 24 days  Discharge activity level: No - min to max assist.  Pt had supervision level goals and did not meet original goals due to unwillingness to do certain tasks to facilitate more independence.  Home/community participation: Yes  Services provided included: MD, RD, PT, OT, RN, TR, Pharmacy, Neuropsych and SW  Financial Services: Other: Pt met with financial counselor and Medicaid and Social Security Disability applications were completed and submitted.  Follow-up services arranged: Home Health: PT/OT/RN/SW from [facility], DME: tub bench; w/c; 30" slide board; hospital bed from [facility] and Patient/Family has no preference for HH/DME agencies  Comments (or additional information):  Pt to d/c home.  Ramp to be installed on 02-24-17, but pt's friends to carry him in the house today.  Pt given mental health resources and Dr. Ferne Coe contact information to f/u, as pt has continued to remain flat and lacks motivation.    Patient/Family verbalized understanding of follow-up arrangements: Yes  Individual responsible for coordination of the follow-up plan: pt with support from his mother, grandmother, and friends  Confirmed correct DME delivered: Trey Sailors 02/22/2017    Noelie Renfrow, Silvestre Mesi

## 2017-02-22 NOTE — Progress Notes (Signed)
Social Work Patient ID: Craig Calderon, male   DOB: 03/21/1991, 26 y.o.   MRN: [id number]   CSW met with pt and his mother 02-20-17 to update them on team conference discussion.  Pt was quiet with CSW but stated he feels ready to go home and was pleased that he is going home a day sooner.  Mother feels she has been trained.  She will call people who are to install the ramp to see when it can happen given d/c is 02-22-17.  Pt will have [facility] PT/OT/SW/RN to f/u with him at home.  He will also have equipment delivered to the house.  CSW remains available to assist as needed.
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
## 2017-02-27 ENCOUNTER — Ambulatory Visit: Payer: Self-pay | Attending: Internal Medicine | Admitting: Physician Assistant

## 2017-02-27 ENCOUNTER — Ambulatory Visit: Payer: Self-pay | Admitting: Licensed Clinical Social Worker

## 2017-02-27 VITALS — BP 140/87 | HR 126 | Temp 98.0°F | Resp 18 | Ht 73.0 in | Wt 210.0 lb

## 2017-02-27 DIAGNOSIS — F4323 Adjustment disorder with mixed anxiety and depressed mood: Secondary | ICD-10-CM

## 2017-02-27 DIAGNOSIS — W3400XA Accidental discharge from unspecified firearms or gun, initial encounter: Secondary | ICD-10-CM

## 2017-02-27 DIAGNOSIS — S31109A Unspecified open wound of abdominal wall, unspecified quadrant without penetration into peritoneal cavity, initial encounter: Secondary | ICD-10-CM

## 2017-02-27 DIAGNOSIS — N179 Acute kidney failure, unspecified: Secondary | ICD-10-CM | POA: Insufficient documentation

## 2017-02-27 DIAGNOSIS — S299XXA Unspecified injury of thorax, initial encounter: Secondary | ICD-10-CM

## 2017-02-27 DIAGNOSIS — S31139A Puncture wound of abdominal wall without foreign body, unspecified quadrant without penetration into peritoneal cavity, initial encounter: Secondary | ICD-10-CM

## 2017-02-27 DIAGNOSIS — S24104A Unspecified injury at T11-T12 level of thoracic spinal cord, initial encounter: Secondary | ICD-10-CM

## 2017-02-27 DIAGNOSIS — D72829 Elevated white blood cell count, unspecified: Secondary | ICD-10-CM | POA: Insufficient documentation

## 2017-02-27 DIAGNOSIS — E871 Hypo-osmolality and hyponatremia: Secondary | ICD-10-CM | POA: Insufficient documentation

## 2017-02-27 MED ORDER — KETOROLAC TROMETHAMINE 60 MG/2ML IM SOLN
60.0000 mg | Freq: Once | INTRAMUSCULAR | Status: AC
  Administered 2017-02-27: 60 mg via INTRAMUSCULAR
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
## 2017-02-27 NOTE — Patient Instructions (Addendum)
Ranelle OysterSwartz, Zachary T, MD Follow up.   Specialty:  Physical Medicine and Rehabilitation Why:  office will call you with follow up appointment Contact information: [address] [place] [address] [phone]        Manus Ruddsuei, Matthew, MD Follow up.   Specialty:  General Surgery Why:  for follow up appointment  Contact information: [address] [place] [address] [phone]        Ihor Gullyttelin, Mark, MD. Call in 1 day(s).   Specialty:  Urology Why:  for follow up appointment Contact information: [address] [place] [address] [phone]
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
## 2017-02-28 ENCOUNTER — Inpatient Hospital Stay (HOSPITAL_COMMUNITY)
Admission: EM | Admit: 2017-02-28 | Discharge: 2017-03-02 | DRG: 699 | Disposition: A | Discharge status: Home / Self Care | Payer: Medicaid Other | Attending: Internal Medicine | Admitting: Internal Medicine

## 2017-02-28 ENCOUNTER — Telehealth: Payer: Self-pay | Admitting: *Deleted

## 2017-02-28 ENCOUNTER — Encounter (HOSPITAL_COMMUNITY): Payer: Self-pay | Admitting: Emergency Medicine

## 2017-02-28 DIAGNOSIS — Z809 Family history of malignant neoplasm, unspecified: Secondary | ICD-10-CM

## 2017-02-28 DIAGNOSIS — N390 Urinary tract infection, site not specified: Secondary | ICD-10-CM | POA: Diagnosis present

## 2017-02-28 DIAGNOSIS — Y846 Urinary catheterization as the cause of abnormal reaction of the patient, or of later complication, without mention of misadventure at the time of the procedure: Secondary | ICD-10-CM | POA: Diagnosis present

## 2017-02-28 DIAGNOSIS — T83518A Infection and inflammatory reaction due to other urinary catheter, initial encounter: Principal | ICD-10-CM | POA: Diagnosis present

## 2017-02-28 DIAGNOSIS — R319 Hematuria, unspecified: Secondary | ICD-10-CM | POA: Diagnosis present

## 2017-02-28 DIAGNOSIS — S24104S Unspecified injury at T11-T12 level of thoracic spinal cord, sequela: Secondary | ICD-10-CM

## 2017-02-28 DIAGNOSIS — Z833 Family history of diabetes mellitus: Secondary | ICD-10-CM

## 2017-02-28 DIAGNOSIS — F17210 Nicotine dependence, cigarettes, uncomplicated: Secondary | ICD-10-CM | POA: Diagnosis present

## 2017-02-28 DIAGNOSIS — S24104D Unspecified injury at T11-T12 level of thoracic spinal cord, subsequent encounter: Secondary | ICD-10-CM

## 2017-02-28 DIAGNOSIS — G8220 Paraplegia, unspecified: Secondary | ICD-10-CM | POA: Diagnosis present

## 2017-02-28 DIAGNOSIS — I10 Essential (primary) hypertension: Secondary | ICD-10-CM | POA: Diagnosis present

## 2017-02-28 DIAGNOSIS — S24104A Unspecified injury at T11-T12 level of thoracic spinal cord, initial encounter: Secondary | ICD-10-CM | POA: Diagnosis present

## 2017-02-28 DIAGNOSIS — Z8249 Family history of ischemic heart disease and other diseases of the circulatory system: Secondary | ICD-10-CM

## 2017-02-28 DIAGNOSIS — Z9081 Acquired absence of spleen: Secondary | ICD-10-CM

## 2017-02-28 DIAGNOSIS — Z7901 Long term (current) use of anticoagulants: Secondary | ICD-10-CM

## 2017-02-28 DIAGNOSIS — W3400XA Accidental discharge from unspecified firearms or gun, initial encounter: Secondary | ICD-10-CM

## 2017-02-28 DIAGNOSIS — F4310 Post-traumatic stress disorder, unspecified: Secondary | ICD-10-CM | POA: Diagnosis present

## 2017-02-28 HISTORY — DX: Accidental discharge from unspecified firearms or gun, initial encounter: W34.00XA

## 2017-02-28 HISTORY — DX: Urinary tract infection, site not specified: N39.0

## 2017-02-28 LAB — CBC WITH DIFFERENTIAL/PLATELET
BASOS ABS: 0 10*3/uL (ref 0.0–0.1)
Basophils Absolute: 0 10*3/uL (ref 0.0–0.2)
Basophils Relative: 0 %
Basos: 0 %
EOS (ABSOLUTE): 0.1 10*3/uL (ref 0.0–0.4)
Eos: 1 %
Eosinophils Absolute: 0.1 10*3/uL (ref 0.0–0.7)
Eosinophils Relative: 1 %
HEMATOCRIT: 35.1 % — AB (ref 39.0–52.0)
Hematocrit: 37 % — ABNORMAL LOW (ref 37.5–51.0)
Hemoglobin: 11.5 g/dL — ABNORMAL LOW (ref 13.0–17.7)
Hemoglobin: 11.5 g/dL — ABNORMAL LOW (ref 13.0–17.0)
IMMATURE GRANULOCYTES: 0 %
Immature Grans (Abs): 0 10*3/uL (ref 0.0–0.1)
Lymphocytes Absolute: 1.9 10*3/uL (ref 0.7–3.1)
Lymphocytes Relative: 13 %
Lymphs Abs: 2 10*3/uL (ref 0.7–4.0)
Lymphs: 18 %
MCH: 27.2 pg (ref 26.6–33.0)
MCH: 27.4 pg (ref 26.0–34.0)
MCHC: 31.1 g/dL — ABNORMAL LOW (ref 31.5–35.7)
MCHC: 32.8 g/dL (ref 30.0–36.0)
MCV: 88 fL (ref 79–97)
MCV: 83.8 fL (ref 78.0–100.0)
MONOS ABS: 0.5 10*3/uL (ref 0.1–0.9)
Monocytes Absolute: 0.6 10*3/uL (ref 0.1–1.0)
Monocytes Relative: 4 %
Monocytes: 5 %
NEUTROS ABS: 12.4 10*3/uL — AB (ref 1.7–7.7)
NEUTROS PCT: 76 %
Neutrophils Absolute: 7.9 10*3/uL — ABNORMAL HIGH (ref 1.4–7.0)
Neutrophils Relative %: 82 %
PLATELETS: 875 10*3/uL — AB (ref 150–379)
Platelets: 752 10*3/uL — ABNORMAL HIGH (ref 150–400)
RBC: 4.23 x10E6/uL (ref 4.14–5.80)
RBC: 4.19 MIL/uL — AB (ref 4.22–5.81)
RDW: 15.6 % — ABNORMAL HIGH (ref 12.3–15.4)
RDW: 15.2 % (ref 11.5–15.5)
WBC: 10.5 10*3/uL (ref 3.4–10.8)
WBC: 15.1 10*3/uL — AB (ref 4.0–10.5)

## 2017-02-28 LAB — COMPREHENSIVE METABOLIC PANEL
ALK PHOS: 163 IU/L — AB (ref 39–117)
ALT: 58 IU/L — ABNORMAL HIGH (ref 0–44)
ALT: 55 U/L (ref 17–63)
AST: 40 IU/L (ref 0–40)
AST: 38 U/L (ref 15–41)
Albumin/Globulin Ratio: 0.8 — ABNORMAL LOW (ref 1.2–2.2)
Albumin: 3.6 g/dL (ref 3.5–5.5)
Albumin: 2.9 g/dL — ABNORMAL LOW (ref 3.5–5.0)
Alkaline Phosphatase: 128 U/L — ABNORMAL HIGH (ref 38–126)
Anion gap: 8 (ref 5–15)
BILIRUBIN TOTAL: 0.4 mg/dL (ref 0.0–1.2)
BILIRUBIN TOTAL: 0.4 mg/dL (ref 0.3–1.2)
BUN/Creatinine Ratio: 13 (ref 9–20)
BUN: 11 mg/dL (ref 6–20)
BUN: 9 mg/dL (ref 6–20)
CO2: 24 mmol/L (ref 20–29)
CO2: 26 mmol/L (ref 22–32)
Calcium: 10.3 mg/dL — ABNORMAL HIGH (ref 8.7–10.2)
Calcium: 9.6 mg/dL (ref 8.9–10.3)
Chloride: 96 mmol/L (ref 96–106)
Chloride: 100 mmol/L — ABNORMAL LOW (ref 101–111)
Creatinine, Ser: 0.86 mg/dL (ref 0.76–1.27)
Creatinine, Ser: 0.87 mg/dL (ref 0.61–1.24)
GFR calc Af Amer: 139 mL/min/{1.73_m2} (ref >59)
GFR calc non Af Amer: 121 mL/min/{1.73_m2} (ref >59)
GLOBULIN, TOTAL: 4.8 g/dL — AB (ref 1.5–4.5)
Glucose, Bld: 100 mg/dL — ABNORMAL HIGH (ref 65–99)
Glucose: 76 mg/dL (ref 65–99)
Potassium: 4.8 mmol/L (ref 3.5–5.2)
Potassium: 4 mmol/L (ref 3.5–5.1)
Sodium: 137 mmol/L (ref 134–144)
Sodium: 134 mmol/L — ABNORMAL LOW (ref 135–145)
TOTAL PROTEIN: 8.1 g/dL (ref 6.5–8.1)
Total Protein: 8.4 g/dL (ref 6.0–8.5)

## 2017-02-28 LAB — URINALYSIS, ROUTINE W REFLEX MICROSCOPIC
Bilirubin Urine: NEGATIVE
GLUCOSE, UA: NEGATIVE mg/dL
KETONES UR: NEGATIVE mg/dL
NITRITE: NEGATIVE
PH: 6 (ref 5.0–8.0)
PROTEIN: NEGATIVE mg/dL
SQUAMOUS EPITHELIAL / LPF: NONE SEEN
Specific Gravity, Urine: 1.015 (ref 1.005–1.030)

## 2017-02-28 LAB — I-STAT CG4 LACTIC ACID, ED: Lactic Acid, Venous: 0.72 mmol/L (ref 0.5–1.9)

## 2017-02-28 MED ORDER — LEVETIRACETAM 500 MG PO TABS
500.0000 mg | ORAL_TABLET | Freq: Two times a day (BID) | ORAL | Status: DC
  Administered 2017-03-01 – 2017-03-02 (×4): 500 mg via ORAL
  Filled 2017-02-28 (×4): qty 1

## 2017-02-28 MED ORDER — OXYCODONE HCL 5 MG PO TABS
10.0000 mg | ORAL_TABLET | Freq: Four times a day (QID) | ORAL | Status: DC | PRN
  Administered 2017-03-01 – 2017-03-02 (×6): 10 mg via ORAL
  Filled 2017-02-28 (×7): qty 2

## 2017-02-28 MED ORDER — ONDANSETRON HCL 4 MG PO TABS: 4.0000 mg | ORAL_TABLET | Freq: Four times a day (QID) | ORAL | Status: DC | PRN

## 2017-02-28 MED ORDER — SENNA 8.6 MG PO TABS
2.0000 | ORAL_TABLET | Freq: Every morning | ORAL | Status: DC
  Administered 2017-03-01 – 2017-03-02 (×2): 17.2 mg via ORAL
  Filled 2017-02-28 (×2): qty 2

## 2017-02-28 MED ORDER — PANTOPRAZOLE SODIUM 40 MG PO TBEC
40.0000 mg | DELAYED_RELEASE_TABLET | Freq: Every day | ORAL | Status: DC
  Administered 2017-03-01 – 2017-03-02 (×2): 40 mg via ORAL
  Filled 2017-02-28 (×2): qty 1

## 2017-02-28 MED ORDER — METHOCARBAMOL 500 MG PO TABS
750.0000 mg | ORAL_TABLET | Freq: Four times a day (QID) | ORAL | Status: DC | PRN
  Administered 2017-03-01 – 2017-03-02 (×5): 750 mg via ORAL
  Filled 2017-02-28 (×7): qty 2

## 2017-02-28 MED ORDER — ACETAMINOPHEN 325 MG PO TABS
650.0000 mg | ORAL_TABLET | Freq: Four times a day (QID) | ORAL | Status: DC | PRN
  Administered 2017-03-01 – 2017-03-02 (×2): 650 mg via ORAL
  Filled 2017-02-28 (×2): qty 2

## 2017-02-28 MED ORDER — BISACODYL 10 MG RE SUPP
10.0000 mg | Freq: Every day | RECTAL | Status: DC
  Administered 2017-03-01 – 2017-03-02 (×2): 10 mg via RECTAL
  Filled 2017-02-28 (×2): qty 1

## 2017-02-28 MED ORDER — SODIUM CHLORIDE 0.9 % IV BOLUS (SEPSIS)
1000.0000 mL | Freq: Once | INTRAVENOUS | Status: AC
  Administered 2017-02-28: 1000 mL via INTRAVENOUS

## 2017-02-28 MED ORDER — NORTRIPTYLINE HCL 25 MG PO CAPS
75.0000 mg | ORAL_CAPSULE | Freq: Every day | ORAL | Status: DC
  Administered 2017-03-01: 75 mg via ORAL
  Filled 2017-02-28 (×2): qty 3

## 2017-02-28 MED ORDER — ONDANSETRON HCL 4 MG/2ML IJ SOLN: 4.0000 mg | Freq: Four times a day (QID) | INTRAMUSCULAR | Status: DC | PRN

## 2017-02-28 MED ORDER — ENOXAPARIN SODIUM 40 MG/0.4ML ~~LOC~~ SOLN
40.0000 mg | SUBCUTANEOUS | Status: DC
  Administered 2017-03-01 – 2017-03-02 (×2): 40 mg via SUBCUTANEOUS
  Filled 2017-02-28 (×2): qty 0.4

## 2017-02-28 MED ORDER — CEFTRIAXONE SODIUM 1 G IJ SOLR
1.0000 g | Freq: Once | INTRAMUSCULAR | Status: AC
  Administered 2017-02-28: 1 g via INTRAVENOUS
  Filled 2017-02-28: qty 10

## 2017-02-28 MED ORDER — ACETAMINOPHEN 650 MG RE SUPP: 650.0000 mg | Freq: Four times a day (QID) | RECTAL | Status: DC | PRN

## 2017-02-28 MED ORDER — DEXTROSE 5 % IV SOLN
2.0000 g | Freq: Two times a day (BID) | INTRAVENOUS | Status: DC
  Administered 2017-02-28 – 2017-03-02 (×4): 2 g via INTRAVENOUS
  Filled 2017-02-28 (×4): qty 2
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
## 2017-02-28 NOTE — ED Notes (Signed)
Contact Information (Mother) [phone]

## 2017-02-28 NOTE — ED Notes (Addendum)
Pt states he had some blood at the tip of his penis and with burning. Pt does have a urinary catheter in place.

## 2017-02-28 NOTE — Progress Notes (Signed)
Pharmacy Antibiotic Note Craig Calderon is a 26 y.o. male admitted on 02/28/2017 with concern for UTI. Recently completed outpatient course of Keflex with out improvement. Pharmacy has been consulted for Cefepime dosing.  Plan: 1. Begin Cefepime 2 gram IV every 12 hours  2. Follow up culture data and narrow antibiotics as feasible    Temp (24hrs), Avg:98.7 F (37.1 C), Min:98.7 F (37.1 C), Max:98.7 F (37.1 C)   Recent Labs Lab 02/27/17 1149 02/28/17 1913 02/28/17 2013  WBC 10.5 15.1*  --   CREATININE 0.86 0.87  --   LATICACIDVEN  --   --  0.72    Estimated Creatinine Clearance: 146.7 mL/min (by C-G formula based on SCr of 0.87 mg/dL).    No Known Allergies  Antimicrobials this admission: 6/27 Cefepime  >>   Microbiology results: 6/27 UCx: px   Thank you for allowing pharmacy to be a part of this patient's care.  Pollyann SamplesAndy Razi Hickle, PharmD, BCPS 02/28/2017, 11:04 PM
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
## 2017-02-28 NOTE — Telephone Encounter (Signed)
[facility], eval was done today.  Also she is reporting his BP was 140/100 but he had been to MD appt and had transferred twice from car to chair before BP taken.  Approval given. [facility] and they will be overseeing medical management. BP monitoring to continue, elevation most likely related to just getting in from appointments and stress of learning to transfer by use of transfer board from car to chair.

## 2017-03-01 ENCOUNTER — Encounter (HOSPITAL_COMMUNITY): Payer: Self-pay | Admitting: General Practice

## 2017-03-01 DIAGNOSIS — F4310 Post-traumatic stress disorder, unspecified: Secondary | ICD-10-CM | POA: Diagnosis present

## 2017-03-01 DIAGNOSIS — Z833 Family history of diabetes mellitus: Secondary | ICD-10-CM | POA: Diagnosis not present

## 2017-03-01 DIAGNOSIS — Z8249 Family history of ischemic heart disease and other diseases of the circulatory system: Secondary | ICD-10-CM | POA: Diagnosis not present

## 2017-03-01 DIAGNOSIS — G8220 Paraplegia, unspecified: Secondary | ICD-10-CM | POA: Diagnosis present

## 2017-03-01 DIAGNOSIS — S24104S Unspecified injury at T11-T12 level of thoracic spinal cord, sequela: Secondary | ICD-10-CM | POA: Diagnosis not present

## 2017-03-01 DIAGNOSIS — R319 Hematuria, unspecified: Secondary | ICD-10-CM | POA: Diagnosis present

## 2017-03-01 DIAGNOSIS — Z9081 Acquired absence of spleen: Secondary | ICD-10-CM | POA: Diagnosis not present

## 2017-03-01 DIAGNOSIS — N10 Acute tubulo-interstitial nephritis: Secondary | ICD-10-CM

## 2017-03-01 DIAGNOSIS — N390 Urinary tract infection, site not specified: Secondary | ICD-10-CM | POA: Diagnosis present

## 2017-03-01 DIAGNOSIS — Y846 Urinary catheterization as the cause of abnormal reaction of the patient, or of later complication, without mention of misadventure at the time of the procedure: Secondary | ICD-10-CM | POA: Diagnosis present

## 2017-03-01 DIAGNOSIS — F17210 Nicotine dependence, cigarettes, uncomplicated: Secondary | ICD-10-CM | POA: Diagnosis present

## 2017-03-01 DIAGNOSIS — T83518A Infection and inflammatory reaction due to other urinary catheter, initial encounter: Secondary | ICD-10-CM | POA: Diagnosis present

## 2017-03-01 DIAGNOSIS — R3129 Other microscopic hematuria: Secondary | ICD-10-CM

## 2017-03-01 DIAGNOSIS — I10 Essential (primary) hypertension: Secondary | ICD-10-CM | POA: Diagnosis present

## 2017-03-01 DIAGNOSIS — W3400XA Accidental discharge from unspecified firearms or gun, initial encounter: Secondary | ICD-10-CM | POA: Diagnosis not present

## 2017-03-01 DIAGNOSIS — Z809 Family history of malignant neoplasm, unspecified: Secondary | ICD-10-CM | POA: Diagnosis not present

## 2017-03-01 DIAGNOSIS — Z7901 Long term (current) use of anticoagulants: Secondary | ICD-10-CM | POA: Diagnosis not present
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
## 2017-03-01 NOTE — Telephone Encounter (Signed)
Good to know. BTW--he was admitted to hospital yesterday for UTI

## 2017-03-02 DIAGNOSIS — S24104A Unspecified injury at T11-T12 level of thoracic spinal cord, initial encounter: Secondary | ICD-10-CM

## 2017-03-02 DIAGNOSIS — N020 Recurrent and persistent hematuria with minor glomerular abnormality: Secondary | ICD-10-CM

## 2017-03-02 LAB — COMPREHENSIVE METABOLIC PANEL
ALT: 56 U/L (ref 17–63)
AST: 39 U/L (ref 15–41)
Albumin: 2.6 g/dL — ABNORMAL LOW (ref 3.5–5.0)
Alkaline Phosphatase: 113 U/L (ref 38–126)
Anion gap: 9 (ref 5–15)
BUN: 8 mg/dL (ref 6–20)
CHLORIDE: 99 mmol/L — AB (ref 101–111)
CO2: 24 mmol/L (ref 22–32)
CREATININE: 0.83 mg/dL (ref 0.61–1.24)
Calcium: 9.1 mg/dL (ref 8.9–10.3)
GFR calc non Af Amer: >60 mL/min (ref >60)
Glucose, Bld: 92 mg/dL (ref 65–99)
Potassium: 3.9 mmol/L (ref 3.5–5.1)
SODIUM: 132 mmol/L — AB (ref 135–145)
Total Bilirubin: 0.4 mg/dL (ref 0.3–1.2)
Total Protein: 7.5 g/dL (ref 6.5–8.1)

## 2017-03-02 LAB — CBC
HCT: 33.9 % — ABNORMAL LOW (ref 39.0–52.0)
Hemoglobin: 10.6 g/dL — ABNORMAL LOW (ref 13.0–17.0)
MCH: 26.6 pg (ref 26.0–34.0)
MCHC: 31.3 g/dL (ref 30.0–36.0)
MCV: 85 fL (ref 78.0–100.0)
PLATELETS: 737 10*3/uL — AB (ref 150–400)
RBC: 3.99 MIL/uL — AB (ref 4.22–5.81)
RDW: 15.4 % (ref 11.5–15.5)
WBC: 10.4 10*3/uL (ref 4.0–10.5)

## 2017-03-02 MED ORDER — CIPROFLOXACIN HCL 500 MG PO TABS: 500.0000 mg | ORAL_TABLET | Freq: Two times a day (BID) | ORAL | 0 refills | Status: AC

## 2017-03-02 MED FILL — CIPROFLOXACIN HCL 500 MG TA: 500 | 7 days supply | Qty: 14 | Fill #0

## 2017-03-02 NOTE — Progress Notes (Signed)
PT Cancellation Note  Patient Details Name: Craig Calderon MRN: [id number] DOB: 02/12/1991   Cancelled Treatment:    Reason Eval/Treat Not Completed: PT screened, no needs identified, will sign off. Pt has all equipment at home, and has no acute PT needs. To continue HHPT at home. Please re-consult if needs change.   Gladys DammeBrittany Yue Glasheen, PT, DPT  Acute Rehabilitation Services  Pager: [phone]    Lehman PromBrittany S Roselynn Whitacre 03/02/2017, 1:27 PM

## 2017-03-02 NOTE — Progress Notes (Signed)
OT Cancellation Note  Patient Details Name: Craig Calderon MRN: [id number] DOB: 04/18/1991   Cancelled Treatment:    Reason Eval/Treat Not Completed: OT screened, no needs identified, will sign off.  Evern BioMayberry, Iyonna Rish Lynn 03/02/2017, 1:25 PM  [phone]
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
## 2017-03-03 LAB — URINE CULTURE: Culture: >=100000 — AB

## 2017-03-06 ENCOUNTER — Ambulatory Visit (HOSPITAL_COMMUNITY)
Admission: RE | Admit: 2017-03-06 | Discharge: 2017-03-06 | Disposition: A | Discharge status: Home / Self Care | Payer: Medicaid Other | Source: Ambulatory Visit | Attending: Family Medicine | Admitting: Family Medicine

## 2017-03-06 ENCOUNTER — Other Ambulatory Visit: Payer: Self-pay

## 2017-03-06 ENCOUNTER — Encounter: Payer: Self-pay | Admitting: Family Medicine

## 2017-03-06 ENCOUNTER — Ambulatory Visit: Payer: Medicaid Other | Attending: Family Medicine | Admitting: Family Medicine

## 2017-03-06 VITALS — BP 138/98 | HR 135 | Temp 98.7°F | Resp 18 | Ht 73.0 in | Wt 210.0 lb

## 2017-03-06 DIAGNOSIS — I517 Cardiomegaly: Secondary | ICD-10-CM | POA: Diagnosis not present

## 2017-03-06 DIAGNOSIS — R61 Generalized hyperhidrosis: Secondary | ICD-10-CM | POA: Insufficient documentation

## 2017-03-06 DIAGNOSIS — Z993 Dependence on wheelchair: Secondary | ICD-10-CM | POA: Insufficient documentation

## 2017-03-06 DIAGNOSIS — Z09 Encounter for follow-up examination after completed treatment for conditions other than malignant neoplasm: Secondary | ICD-10-CM

## 2017-03-06 DIAGNOSIS — R000 Tachycardia, unspecified: Secondary | ICD-10-CM | POA: Insufficient documentation

## 2017-03-06 DIAGNOSIS — R319 Hematuria, unspecified: Secondary | ICD-10-CM | POA: Insufficient documentation

## 2017-03-06 DIAGNOSIS — G8389 Other specified paralytic syndromes: Secondary | ICD-10-CM | POA: Diagnosis not present

## 2017-03-06 DIAGNOSIS — Z978 Presence of other specified devices: Secondary | ICD-10-CM

## 2017-03-06 DIAGNOSIS — Z8249 Family history of ischemic heart disease and other diseases of the circulatory system: Secondary | ICD-10-CM | POA: Diagnosis not present

## 2017-03-06 DIAGNOSIS — I10 Essential (primary) hypertension: Secondary | ICD-10-CM

## 2017-03-06 DIAGNOSIS — Z9081 Acquired absence of spleen: Secondary | ICD-10-CM | POA: Insufficient documentation

## 2017-03-06 DIAGNOSIS — K5900 Constipation, unspecified: Secondary | ICD-10-CM | POA: Diagnosis not present

## 2017-03-06 DIAGNOSIS — Z79899 Other long term (current) drug therapy: Secondary | ICD-10-CM | POA: Diagnosis not present

## 2017-03-06 DIAGNOSIS — Z9289 Personal history of other medical treatment: Secondary | ICD-10-CM

## 2017-03-06 DIAGNOSIS — Z960 Presence of urogenital implants: Secondary | ICD-10-CM

## 2017-03-06 DIAGNOSIS — Z5189 Encounter for other specified aftercare: Secondary | ICD-10-CM | POA: Insufficient documentation

## 2017-03-06 MED ORDER — METOPROLOL TARTRATE 25 MG PO TABS: 25.0000 mg | ORAL_TABLET | Freq: Every day | ORAL | 2 refills | Status: DC

## 2017-03-06 MED ORDER — BISACODYL 10 MG RE SUPP: 10.0000 mg | Freq: Every day | RECTAL | 2 refills | Status: DC | PRN

## 2017-03-06 MED FILL — METOPROLOL TARTRATE 25 MG T: 25 | 30 days supply | Qty: 30 | Fill #0
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
## 2017-03-06 NOTE — Progress Notes (Signed)
Patient has eaten  °Patient has taken medication  ° °

## 2017-03-06 NOTE — Patient Instructions (Addendum)
Apply for orange card Indwelling Urinary Catheter Care, Adult Take good care of your catheter to keep it working and to prevent problems. How to wear your catheter Attach your catheter to your leg with tape (adhesive tape) or a leg strap. Make sure it is not too tight. If you use tape, remove any bits of tape that are already on the catheter. How to wear a drainage bag You should have:  A large overnight bag.  A small leg bag.  Overnight Bag You may wear the overnight bag at any time. Always keep the bag below the level of your bladder but off the floor. When you sleep, put a clean plastic bag in a wastebasket. Then hang the bag inside the wastebasket. Leg Bag Never wear the leg bag at night. Always wear the leg bag below your knee. Keep the leg bag secure with a leg strap or tape. How to care for your skin  Clean the skin around the catheter at least once every day.  Shower every day. Do not take baths.  Put creams, lotions, or ointments on your genital area only as told by your doctor.  Do not use powders, sprays, or lotions on your genital area. How to clean your catheter and your skin 1. Wash your hands with soap and water. 2. Wet a washcloth in warm water and gentle (mild) soap. 3. Use the washcloth to clean the skin where the catheter enters your body. Clean downward and wipe away from the catheter in small circles. Do not wipe toward the catheter. 4. Pat the area dry with a clean towel. Make sure to clean off all soap. How to care for your drainage bags Empty your drainage bag when it is ?- full or at least 2-3 times a day. Replace your drainage bag once a month or sooner if it starts to smell bad or look dirty. Do not clean your drainage bag unless told by your doctor. Emptying a drainage bag  Supplies Needed  Rubbing alcohol.  Gauze pad or cotton ball.  Tape or a leg strap.  Steps 1. Wash your hands with soap and water. 2. Separate (detach) the bag from your  leg. 3. Hold the bag over the toilet or a clean container. Keep the bag below your hips and bladder. This stops pee (urine) from going back into the tube. 4. Open the pour spout at the bottom of the bag. 5. Empty the pee into the toilet or container. Do not let the pour spout touch any surface. 6. Put rubbing alcohol on a gauze pad or cotton ball. 7. Use the gauze pad or cotton ball to clean the pour spout. 8. Close the pour spout. 9. Attach the bag to your leg with tape or a leg strap. 10. Wash your hands.  Changing a drainage bag Supplies Needed  Alcohol wipes.  A clean drainage bag.  Adhesive tape or a leg strap.  Steps 1. Wash your hands with soap and water. 2. Separate the dirty bag from your leg. 3. Pinch the rubber catheter with your fingers so that pee does not spill out. 4. Separate the catheter tube from the drainage tube where these tubes connect (at the connection valve). Do not let the tubes touch any surface. 5. Clean the end of the catheter tube with an alcohol wipe. Use a different alcohol wipe to clean the end of the drainage tube. 6. Connect the catheter tube to the drainage tube of the clean bag. 7. Attach  the new bag to the leg with adhesive tape or a leg strap. 8. Wash your hands.  How to prevent infection and other problems  Never pull on your catheter or try to remove it. Pulling can damage tissue in your body.  Always wash your hands before and after touching your catheter.  If a leg strap gets wet, replace it with a dry one.  Drink enough fluids to keep your pee clear or pale yellow, or as told by your doctor.  Do not let the drainage bag or tubing touch the floor.  Wear cotton underwear.  If you are male, wipe from front to back after you poop (have a bowel movement).  Check on the catheter often to make sure it works and the tubing is not twisted. Get help if:  Your pee is cloudy.  Your pee smells unusually bad.  Your pee is not  draining into the bag.  Your tube gets clogged.  Your catheter starts to leak.  Your bladder feels full. Get help right away if:  You have redness, swelling, or pain where the catheter enters your body.  You have fluid, pus, or a bad smell coming from the area where the catheter enters your body.  The area where the catheter enters your body feels warm.  You have a fever.  You have pain in your: ? Stomach (abdomen). ? Legs. ? Lower back. ? Bladder.  You see blood fill the catheter.  Your pee is pink or red.  You feel sick to your stomach (nauseous).  You throw up (vomit).  You have chills.  Your catheter gets pulled out. This information is not intended to replace advice given to you by your health care provider. Make sure you discuss any questions you have with your health care provider. Document Released: 12/16/2012 Document Revised: 07/19/2016 Document Reviewed: 02/03/2014 Elsevier Interactive Patient Education  2018 [employer].  Hypertension Hypertension is another name for high blood pressure. High blood pressure forces your heart to work harder to pump blood. This can cause problems over time. There are two numbers in a blood pressure reading. There is a top number (systolic) over a bottom number (diastolic). It is best to have a blood pressure below 120/80. Healthy choices can help lower your blood pressure. You may need medicine to help lower your blood pressure if:  Your blood pressure cannot be lowered with healthy choices.  Your blood pressure is higher than 130/80.  Follow these instructions at home: Eating and drinking  If directed, follow the DASH eating plan. This diet includes: ? Filling half of your plate at each meal with fruits and vegetables. ? Filling one quarter of your plate at each meal with whole grains. Whole grains include whole wheat pasta, brown rice, and whole grain bread. ? Eating or drinking low-fat dairy products, such as skim  milk or low-fat yogurt. ? Filling one quarter of your plate at each meal with low-fat (lean) proteins. Low-fat proteins include fish, skinless chicken, eggs, beans, and tofu. ? Avoiding fatty meat, cured and processed meat, or chicken with skin. ? Avoiding premade or processed food.  Eat less than 1,500 mg of salt (sodium) a day.  Limit alcohol use to no more than 1 drink a day for nonpregnant women and 2 drinks a day for men. One drink equals 12 oz of beer, 5 oz of wine, or 1 oz of hard liquor. Lifestyle  Work with your doctor to stay at a healthy weight or  to lose weight. Ask your doctor what the best weight is for you.  Get at least 30 minutes of exercise that causes your heart to beat faster (aerobic exercise) most days of the week. This may include walking, swimming, or biking.  Get at least 30 minutes of exercise that strengthens your muscles (resistance exercise) at least 3 days a week. This may include lifting weights or pilates.  Do not use any products that contain nicotine or tobacco. This includes cigarettes and e-cigarettes. If you need help quitting, ask your doctor.  Check your blood pressure at home as told by your doctor.  Keep all follow-up visits as told by your doctor. This is important. Medicines  Take over-the-counter and prescription medicines only as told by your doctor. Follow directions carefully.  Do not skip doses of blood pressure medicine. The medicine does not work as well if you skip doses. Skipping doses also puts you at risk for problems.  Ask your doctor about side effects or reactions to medicines that you should watch for. Contact a doctor if:  You think you are having a reaction to the medicine you are taking.  You have headaches that keep coming back (recurring).  You feel dizzy.  You have swelling in your ankles.  You have trouble with your vision. Get help right away if:  You get a very bad headache.  You start to feel  confused.  You feel weak or numb.  You feel faint.  You get very bad pain in your: ? Chest. ? Belly (abdomen).  You throw up (vomit) more than once.  You have trouble breathing. Summary  Hypertension is another name for high blood pressure.  Making healthy choices can help lower blood pressure. If your blood pressure cannot be controlled with healthy choices, you may need to take medicine. This information is not intended to replace advice given to you by your health care provider. Make sure you discuss any questions you have with your health care provider. Document Released: 02/07/2008 Document Revised: 07/19/2016 Document Reviewed: 07/19/2016 Elsevier Interactive Patient Education  2018 [employer]  Constipation, Adult Constipation is when a person has fewer bowel movements in a week than normal, has difficulty having a bowel movement, or has stools that are dry, hard, or larger than normal. Constipation may be caused by an underlying condition. It may become worse with age if a person takes certain medicines and does not take in enough fluids. Follow these instructions at home: Eating and drinking   Eat foods that have a lot of fiber, such as fresh fruits and vegetables, whole grains, and beans.  Limit foods that are high in fat, low in fiber, or overly processed, such as french fries, hamburgers, cookies, candies, and soda.  Drink enough fluid to keep your urine clear or pale yellow. General instructions  Exercise regularly or as told by your health care provider.  Go to the restroom when you have the urge to go. Do not hold it in.  Take over-the-counter and prescription medicines only as told by your health care provider. These include any fiber supplements.  Practice pelvic floor retraining exercises, such as deep breathing while relaxing the lower abdomen and pelvic floor relaxation during bowel movements.  Watch your condition for any changes.  Keep all  follow-up visits as told by your health care provider. This is important. Contact a health care provider if:  You have pain that gets worse.  You have a fever.  You do not  have a bowel movement after 4 days.  You vomit.  You are not hungry.  You lose weight.  You are bleeding from the anus.  You have thin, pencil-like stools. Get help right away if:  You have a fever and your symptoms suddenly get worse.  You leak stool or have blood in your stool.  Your abdomen is bloated.  You have severe pain in your abdomen.  You feel dizzy or you faint. This information is not intended to replace advice given to you by your health care provider. Make sure you discuss any questions you have with your health care provider. Document Released: 05/19/2004 Document Revised: 03/10/2016 Document Reviewed: 02/09/2016 Elsevier Interactive Patient Education  2017 Elsevier Inc.  Metoprolol tablets What is this medicine? METOPROLOL (me TOE proe lole) is a beta-blocker. Beta-blockers reduce the workload on the heart and help it to beat more regularly. This medicine is used to treat high blood pressure and to prevent chest pain. It is also used to after a heart attack and to prevent an additional heart attack from occurring. This medicine may be used for other purposes; ask your health care provider or pharmacist if you have questions. COMMON BRAND NAME(S): Lopressor What should I tell my health care provider before I take this medicine? They need to know if you have any of these conditions: -diabetes -heart or vessel disease like slow heart rate, worsening heart failure, heart block, sick sinus syndrome or Raynaud's disease -kidney disease -liver disease -lung or breathing disease, like asthma or emphysema -pheochromocytoma -thyroid disease -an unusual or allergic reaction to metoprolol, other beta-blockers, medicines, foods, dyes, or preservatives -pregnant or trying to get  pregnant -breast-feeding How should I use this medicine? Take this medicine by mouth with a drink of water. Follow the directions on the prescription label. Take this medicine immediately after meals. Take your doses at regular intervals. Do not take more medicine than directed. Do not stop taking this medicine suddenly. This could lead to serious heart-related effects. Talk to your pediatrician regarding the use of this medicine in children. Special care may be needed. Overdosage: If you think you have taken too much of this medicine contact a poison control center or emergency room at once. NOTE: This medicine is only for you. Do not share this medicine with others. What if I miss a dose? If you miss a dose, take it as soon as you can. If it is almost time for your next dose, take only that dose. Do not take double or extra doses. What may interact with this medicine? This medicine may interact with the following medications: -certain medicines for blood pressure, heart disease, irregular heart beat -certain medicines for depression like monoamine oxidase (MAO) inhibitors, fluoxetine, or paroxetine -clonidine -dobutamine -epinephrine -isoproterenol -reserpine This list may not describe all possible interactions. Give your health care provider a list of all the medicines, herbs, non-prescription drugs, or dietary supplements you use. Also tell them if you smoke, drink alcohol, or use illegal drugs. Some items may interact with your medicine. What should I watch for while using this medicine? Visit your doctor or health care professional for regular check ups. Contact your doctor right away if your symptoms worsen. Check your blood pressure and pulse rate regularly. Ask your health care professional what your blood pressure and pulse rate should be, and when you should contact them. You may get drowsy or dizzy. Do not drive, use machinery, or do anything that needs mental alertness until you  know how this medicine affects you. Do not sit or stand up quickly, especially if you are an older patient. This reduces the risk of dizzy or fainting spells. Contact your doctor if these symptoms continue. Alcohol may interfere with the effect of this medicine. Avoid alcoholic drinks. What side effects may I notice from receiving this medicine? Side effects that you should report to your doctor or health care professional as soon as possible: -allergic reactions like skin rash, itching or hives -cold or numb hands or feet -depression -difficulty breathing -faint -fever with sore throat -irregular heartbeat, chest pain -rapid weight gain -swollen legs or ankles Side effects that usually do not require medical attention (report to your doctor or health care professional if they continue or are bothersome): -anxiety or nervousness -change in sex drive or performance -dry skin -headache -nightmares or trouble sleeping -short term memory loss -stomach upset or diarrhea -unusually tired This list may not describe all possible side effects. Call your doctor for medical advice about side effects. You may report side effects to FDA at 1-800-FDA-1088. Where should I keep my medicine? Keep out of the reach of children. Store at room temperature between 15 and 30 degrees C (59 and 86 degrees F). Throw away any unused medicine after the expiration date. NOTE: This sheet is a summary. It may not cover all possible information. If you have questions about this medicine, talk to your doctor, pharmacist, or health care provider.  2018 Elsevier/Gold Standard (2013-04-25 14:40:36)

## 2017-03-07 LAB — CMP14+EGFR
A/G RATIO: 0.8 — AB (ref 1.2–2.2)
ALBUMIN: 3.6 g/dL (ref 3.5–5.5)
ALK PHOS: 158 IU/L — AB (ref 39–117)
ALT: 105 IU/L — ABNORMAL HIGH (ref 0–44)
AST: 51 IU/L — ABNORMAL HIGH (ref 0–40)
BUN / CREAT RATIO: 12 (ref 9–20)
BUN: 10 mg/dL (ref 6–20)
Bilirubin Total: 0.3 mg/dL (ref 0.0–1.2)
CO2: 23 mmol/L (ref 20–29)
CREATININE: 0.82 mg/dL (ref 0.76–1.27)
Calcium: 10.2 mg/dL (ref 8.7–10.2)
Chloride: 98 mmol/L (ref 96–106)
GFR calc Af Amer: 142 mL/min/{1.73_m2} (ref >59)
GFR calc non Af Amer: 123 mL/min/{1.73_m2} (ref >59)
GLOBULIN, TOTAL: 4.3 g/dL (ref 1.5–4.5)
Glucose: 95 mg/dL (ref 65–99)
Potassium: 4.4 mmol/L (ref 3.5–5.2)
SODIUM: 138 mmol/L (ref 134–144)
Total Protein: 7.9 g/dL (ref 6.0–8.5)

## 2017-03-07 LAB — CBC WITH DIFFERENTIAL/PLATELET
BASOS: 0 %
Basophils Absolute: 0 10*3/uL (ref 0.0–0.2)
EOS (ABSOLUTE): 0.1 10*3/uL (ref 0.0–0.4)
EOS: 1 %
HEMATOCRIT: 36.3 % — AB (ref 37.5–51.0)
HEMOGLOBIN: 11.4 g/dL — AB (ref 13.0–17.7)
Immature Grans (Abs): 0 10*3/uL (ref 0.0–0.1)
Immature Granulocytes: 0 %
LYMPHS ABS: 1.8 10*3/uL (ref 0.7–3.1)
Lymphs: 18 %
MCH: 26.6 pg (ref 26.6–33.0)
MCHC: 31.4 g/dL — AB (ref 31.5–35.7)
MCV: 85 fL (ref 79–97)
MONOCYTES: 4 %
Monocytes Absolute: 0.4 10*3/uL (ref 0.1–0.9)
NEUTROS ABS: 7.9 10*3/uL — AB (ref 1.4–7.0)
Neutrophils: 77 %
Platelets: 770 10*3/uL — ABNORMAL HIGH (ref 150–379)
RBC: 4.29 x10E6/uL (ref 4.14–5.80)
RDW: 15.4 % (ref 12.3–15.4)
WBC: 10.3 10*3/uL (ref 3.4–10.8)

## 2017-03-08 ENCOUNTER — Telehealth: Payer: Self-pay

## 2017-03-08 NOTE — Telephone Encounter (Signed)
CMA call regarding echo   Patient did not answer but mother did   Patient mother was aware and understood the echo appt that's going be on 03/21/17 @ [facility] @ 2:00 pm

## 2017-03-09 ENCOUNTER — Telehealth (HOSPITAL_COMMUNITY): Payer: Self-pay

## 2017-03-09 NOTE — Telephone Encounter (Signed)
[phone]  Pt needs f/u appointment.

## 2017-03-09 NOTE — Telephone Encounter (Signed)
Made f/u appointment for patient on 7/12 at 11:45 am. Spoke with patient's mother and let her know appointment time and date.   Wells GuilesKelly Rayburn , [facility] 03/09/2017, 2:06 PM Pager: [phone] Consults: [phone] Mon-Fri 7:00 am-4:30 pm Sat-Sun 7:00 am-11:30 am

## 2017-03-12 ENCOUNTER — Telehealth: Payer: Self-pay | Admitting: *Deleted
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
## 2017-03-13 ENCOUNTER — Other Ambulatory Visit (HOSPITAL_COMMUNITY): Payer: Self-pay | Admitting: Urology

## 2017-03-13 DIAGNOSIS — S37009A Unspecified injury of unspecified kidney, initial encounter: Secondary | ICD-10-CM

## 2017-03-14 ENCOUNTER — Telehealth: Payer: Self-pay | Admitting: *Deleted
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
## 2017-03-15 MED ORDER — OXYCODONE HCL 10 MG PO TABS: 10.0000 mg | ORAL_TABLET | Freq: Four times a day (QID) | ORAL | 0 refills | Status: DC | PRN

## 2017-03-15 MED FILL — oxyCODONE HCL 10 MG TABS: 10 | 5 days supply | Qty: 15 | Fill #0

## 2017-03-15 MED FILL — METHOCARBAMOL 750 MG TABLET: 750 | 5 days supply | Qty: 15 | Fill #0
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
## 2017-03-15 NOTE — Addendum Note (Signed)
Addended by: Doreene ElandSHUMAKER, SYBIL W on: 03/15/2017 09:42 AM   Modules accepted: Orders

## 2017-03-19 ENCOUNTER — Other Ambulatory Visit: Payer: Self-pay

## 2017-03-19 MED ORDER — METHOCARBAMOL 750 MG PO TABS: 750.0000 mg | ORAL_TABLET | Freq: Four times a day (QID) | ORAL | 0 refills | Status: DC | PRN

## 2017-03-19 MED FILL — oxyCODONE HCL 10 MG TABS: 10 | 7 days supply | Qty: 30 | Fill #0

## 2017-03-21 ENCOUNTER — Other Ambulatory Visit (HOSPITAL_COMMUNITY): Payer: Self-pay

## 2017-03-21 ENCOUNTER — Ambulatory Visit: Payer: Medicaid Other | Attending: Family Medicine | Admitting: Family Medicine

## 2017-03-21 ENCOUNTER — Encounter: Payer: Self-pay | Admitting: Family Medicine

## 2017-03-21 ENCOUNTER — Other Ambulatory Visit: Payer: Self-pay | Admitting: *Deleted

## 2017-03-21 VITALS — BP 113/73 | HR 127 | Temp 97.8°F | Resp 18 | Ht 73.0 in

## 2017-03-21 DIAGNOSIS — W3400XD Accidental discharge from unspecified firearms or gun, subsequent encounter: Secondary | ICD-10-CM | POA: Diagnosis not present

## 2017-03-21 DIAGNOSIS — L89142 Pressure ulcer of left lower back, stage 2: Secondary | ICD-10-CM

## 2017-03-21 DIAGNOSIS — G8222 Paraplegia, incomplete: Secondary | ICD-10-CM

## 2017-03-21 DIAGNOSIS — R000 Tachycardia, unspecified: Secondary | ICD-10-CM

## 2017-03-21 DIAGNOSIS — F17210 Nicotine dependence, cigarettes, uncomplicated: Secondary | ICD-10-CM

## 2017-03-21 DIAGNOSIS — G4700 Insomnia, unspecified: Secondary | ICD-10-CM | POA: Insufficient documentation

## 2017-03-21 DIAGNOSIS — R82998 Other abnormal findings in urine: Secondary | ICD-10-CM

## 2017-03-21 DIAGNOSIS — F4323 Adjustment disorder with mixed anxiety and depressed mood: Secondary | ICD-10-CM | POA: Diagnosis not present

## 2017-03-21 DIAGNOSIS — Z79899 Other long term (current) drug therapy: Secondary | ICD-10-CM | POA: Insufficient documentation

## 2017-03-21 DIAGNOSIS — G839 Paralytic syndrome, unspecified: Secondary | ICD-10-CM | POA: Insufficient documentation

## 2017-03-21 DIAGNOSIS — N390 Urinary tract infection, site not specified: Secondary | ICD-10-CM

## 2017-03-21 DIAGNOSIS — S24114S Complete lesion at T11-T12 level of thoracic spinal cord, sequela: Secondary | ICD-10-CM

## 2017-03-21 DIAGNOSIS — I10 Essential (primary) hypertension: Secondary | ICD-10-CM | POA: Diagnosis not present

## 2017-03-21 DIAGNOSIS — S21401D Unspecified open wound of right back wall of thorax with penetration into thoracic cavity, subsequent encounter: Secondary | ICD-10-CM

## 2017-03-21 DIAGNOSIS — S24104D Unspecified injury at T11-T12 level of thoracic spinal cord, subsequent encounter: Secondary | ICD-10-CM | POA: Insufficient documentation

## 2017-03-21 DIAGNOSIS — D62 Acute posthemorrhagic anemia: Secondary | ICD-10-CM

## 2017-03-21 DIAGNOSIS — M792 Neuralgia and neuritis, unspecified: Secondary | ICD-10-CM

## 2017-03-21 DIAGNOSIS — S24104S Unspecified injury at T11-T12 level of thoracic spinal cord, sequela: Secondary | ICD-10-CM

## 2017-03-21 DIAGNOSIS — Z7901 Long term (current) use of anticoagulants: Secondary | ICD-10-CM | POA: Insufficient documentation

## 2017-03-21 DIAGNOSIS — S2243XD Multiple fractures of ribs, bilateral, subsequent encounter for fracture with routine healing: Secondary | ICD-10-CM

## 2017-03-21 DIAGNOSIS — S31609D Unspecified open wound of abdominal wall, unspecified quadrant with penetration into peritoneal cavity, subsequent encounter: Secondary | ICD-10-CM

## 2017-03-21 DIAGNOSIS — T83511S Infection and inflammatory reaction due to indwelling urethral catheter, sequela: Secondary | ICD-10-CM

## 2017-03-21 DIAGNOSIS — T8130XD Disruption of wound, unspecified, subsequent encounter: Secondary | ICD-10-CM

## 2017-03-21 DIAGNOSIS — R8299 Other abnormal findings in urine: Secondary | ICD-10-CM

## 2017-03-21 DIAGNOSIS — S271XXD Traumatic hemothorax, subsequent encounter: Secondary | ICD-10-CM

## 2017-03-21 LAB — POCT URINALYSIS DIPSTICK
Glucose, UA: NEGATIVE
Ketones, UA: NEGATIVE
NITRITE UA: NEGATIVE
PH UA: 6 (ref 5.0–8.0)
Protein, UA: >=300
Spec Grav, UA: >=1.03 — AB (ref 1.010–1.025)
UROBILINOGEN UA: 1 U/dL

## 2017-03-21 MED ORDER — NITROFURANTOIN MONOHYD MACRO 100 MG PO CAPS: 100.0000 mg | ORAL_CAPSULE | Freq: Two times a day (BID) | ORAL | 0 refills | Status: DC

## 2017-03-21 MED ORDER — METHOCARBAMOL 750 MG PO TABS: 750.0000 mg | ORAL_TABLET | Freq: Four times a day (QID) | ORAL | 0 refills | Status: DC | PRN

## 2017-03-21 MED FILL — METHOCARBAMOL 750 MG TABLET: 750 | 7 days supply | Qty: 30 | Fill #0

## 2017-03-21 MED FILL — NITROFURANTOIN MONO-MCR 100: 100 | 5 days supply | Qty: 10 | Fill #0
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
## 2017-03-21 NOTE — Patient Instructions (Signed)
Follow up with urology   Indwelling Urinary Catheter Care, Adult Take good care of your catheter to keep it working and to prevent problems. How to wear your catheter Attach your catheter to your leg with tape (adhesive tape) or a leg strap. Make sure it is not too tight. If you use tape, remove any bits of tape that are already on the catheter. How to wear a drainage bag You should have:  A large overnight bag.  A small leg bag.  Overnight Bag You may wear the overnight bag at any time. Always keep the bag below the level of your bladder but off the floor. When you sleep, put a clean plastic bag in a wastebasket. Then hang the bag inside the wastebasket. Leg Bag Never wear the leg bag at night. Always wear the leg bag below your knee. Keep the leg bag secure with a leg strap or tape. How to care for your skin  Clean the skin around the catheter at least once every day.  Shower every day. Do not take baths.  Put creams, lotions, or ointments on your genital area only as told by your doctor.  Do not use powders, sprays, or lotions on your genital area. How to clean your catheter and your skin 1. Wash your hands with soap and water. 2. Wet a washcloth in warm water and gentle (mild) soap. 3. Use the washcloth to clean the skin where the catheter enters your body. Clean downward and wipe away from the catheter in small circles. Do not wipe toward the catheter. 4. Pat the area dry with a clean towel. Make sure to clean off all soap. How to care for your drainage bags Empty your drainage bag when it is ?- full or at least 2-3 times a day. Replace your drainage bag once a month or sooner if it starts to smell bad or look dirty. Do not clean your drainage bag unless told by your doctor. Emptying a drainage bag  Supplies Needed  Rubbing alcohol.  Gauze pad or cotton ball.  Tape or a leg strap.  Steps 1. Wash your hands with soap and water. 2. Separate (detach) the bag from your  leg. 3. Hold the bag over the toilet or a clean container. Keep the bag below your hips and bladder. This stops pee (urine) from going back into the tube. 4. Open the pour spout at the bottom of the bag. 5. Empty the pee into the toilet or container. Do not let the pour spout touch any surface. 6. Put rubbing alcohol on a gauze pad or cotton ball. 7. Use the gauze pad or cotton ball to clean the pour spout. 8. Close the pour spout. 9. Attach the bag to your leg with tape or a leg strap. 10. Wash your hands.  Changing a drainage bag Supplies Needed  Alcohol wipes.  A clean drainage bag.  Adhesive tape or a leg strap.  Steps 1. Wash your hands with soap and water. 2. Separate the dirty bag from your leg. 3. Pinch the rubber catheter with your fingers so that pee does not spill out. 4. Separate the catheter tube from the drainage tube where these tubes connect (at the connection valve). Do not let the tubes touch any surface. 5. Clean the end of the catheter tube with an alcohol wipe. Use a different alcohol wipe to clean the end of the drainage tube. 6. Connect the catheter tube to the drainage tube of the clean bag.  7. Attach the new bag to the leg with adhesive tape or a leg strap. 8. Wash your hands.  How to prevent infection and other problems  Never pull on your catheter or try to remove it. Pulling can damage tissue in your body.  Always wash your hands before and after touching your catheter.  If a leg strap gets wet, replace it with a dry one.  Drink enough fluids to keep your pee clear or pale yellow, or as told by your doctor.  Do not let the drainage bag or tubing touch the floor.  Wear cotton underwear.  If you are male, wipe from front to back after you poop (have a bowel movement).  Check on the catheter often to make sure it works and the tubing is not twisted. Get help if:  Your pee is cloudy.  Your pee smells unusually bad.  Your pee is not  draining into the bag.  Your tube gets clogged.  Your catheter starts to leak.  Your bladder feels full. Get help right away if:  You have redness, swelling, or pain where the catheter enters your body.  You have fluid, pus, or a bad smell coming from the area where the catheter enters your body.  The area where the catheter enters your body feels warm.  You have a fever.  You have pain in your: ? Stomach (abdomen). ? Legs. ? Lower back. ? Bladder.  You see blood fill the catheter.  Your pee is pink or red.  You feel sick to your stomach (nauseous).  You throw up (vomit).  You have chills.  Your catheter gets pulled out. This information is not intended to replace advice given to you by your health care provider. Make sure you discuss any questions you have with your health care provider. Document Released: 12/16/2012 Document Revised: 07/19/2016 Document Reviewed: 02/03/2014 Elsevier Interactive Patient Education  [employer]2018 Elsevier Inc.

## 2017-03-21 NOTE — Progress Notes (Signed)
Patient is here for f/up HTN  Patient complains about feeling sick   Patient complains being cold & he sweating

## 2017-03-22 ENCOUNTER — Encounter: Payer: Self-pay | Admitting: Physical Medicine & Rehabilitation

## 2017-03-22 ENCOUNTER — Encounter: Payer: Self-pay | Admitting: Family Medicine

## 2017-03-22 ENCOUNTER — Ambulatory Visit (HOSPITAL_COMMUNITY)
Admission: RE | Admit: 2017-03-22 | Discharge: 2017-03-22 | Disposition: A | Discharge status: Home / Self Care | Payer: Medicaid Other | Source: Ambulatory Visit | Attending: Urology | Admitting: Urology

## 2017-03-22 DIAGNOSIS — J90 Pleural effusion, not elsewhere classified: Secondary | ICD-10-CM | POA: Insufficient documentation

## 2017-03-22 DIAGNOSIS — J9811 Atelectasis: Secondary | ICD-10-CM | POA: Insufficient documentation

## 2017-03-22 DIAGNOSIS — S37012D Minor contusion of left kidney, subsequent encounter: Secondary | ICD-10-CM | POA: Diagnosis not present

## 2017-03-22 DIAGNOSIS — X58XXXD Exposure to other specified factors, subsequent encounter: Secondary | ICD-10-CM | POA: Insufficient documentation

## 2017-03-22 DIAGNOSIS — S37009D Unspecified injury of unspecified kidney, subsequent encounter: Secondary | ICD-10-CM | POA: Diagnosis present

## 2017-03-22 DIAGNOSIS — S37009A Unspecified injury of unspecified kidney, initial encounter: Secondary | ICD-10-CM

## 2017-03-23 ENCOUNTER — Other Ambulatory Visit: Payer: Self-pay

## 2017-03-23 ENCOUNTER — Encounter: Payer: Self-pay | Admitting: Physical Medicine & Rehabilitation

## 2017-03-23 ENCOUNTER — Other Ambulatory Visit: Payer: Self-pay | Admitting: Family Medicine

## 2017-03-23 ENCOUNTER — Other Ambulatory Visit: Payer: Self-pay | Admitting: *Deleted

## 2017-03-23 MED ORDER — LEVETIRACETAM 500 MG PO TABS: 500.0000 mg | ORAL_TABLET | Freq: Two times a day (BID) | ORAL | 1 refills | Status: DC

## 2017-03-23 MED FILL — levETIRAcetam 500 MG TABS: 500 | 30 days supply | Qty: 60 | Fill #0

## 2017-03-23 NOTE — Telephone Encounter (Signed)
[facility]

## 2017-03-26 ENCOUNTER — Ambulatory Visit: Payer: Self-pay

## 2017-03-26 ENCOUNTER — Encounter: Payer: Self-pay | Admitting: Physical Medicine & Rehabilitation

## 2017-03-27 ENCOUNTER — Ambulatory Visit: Payer: Self-pay

## 2017-03-28 ENCOUNTER — Ambulatory Visit (HOSPITAL_COMMUNITY)
Admission: RE | Admit: 2017-03-28 | Discharge: 2017-03-28 | Disposition: A | Discharge status: Home / Self Care | Payer: Medicaid Other | Source: Ambulatory Visit | Attending: Family Medicine | Admitting: Family Medicine

## 2017-03-28 DIAGNOSIS — I10 Essential (primary) hypertension: Secondary | ICD-10-CM | POA: Insufficient documentation

## 2017-03-28 DIAGNOSIS — R000 Tachycardia, unspecified: Secondary | ICD-10-CM | POA: Diagnosis present

## 2017-03-28 NOTE — Progress Notes (Signed)
  Echocardiogram 2D Echocardiogram has been performed.  Janalyn HarderWest, Selicia Windom R 03/28/2017, 4:02 PM

## 2017-03-29 ENCOUNTER — Encounter: Payer: Self-pay | Admitting: Physical Medicine & Rehabilitation

## 2017-04-02 ENCOUNTER — Telehealth: Payer: Self-pay

## 2017-04-02 ENCOUNTER — Encounter: Payer: Self-pay | Admitting: Physical Medicine & Rehabilitation

## 2017-04-02 ENCOUNTER — Inpatient Hospital Stay: Payer: Self-pay | Admitting: Physical Medicine & Rehabilitation

## 2017-04-02 ENCOUNTER — Other Ambulatory Visit: Payer: Self-pay | Admitting: Family Medicine

## 2017-04-02 ENCOUNTER — Telehealth: Payer: Self-pay | Admitting: *Deleted

## 2017-04-02 DIAGNOSIS — S24104S Unspecified injury at T11-T12 level of thoracic spinal cord, sequela: Secondary | ICD-10-CM

## 2017-04-02 DIAGNOSIS — N390 Urinary tract infection, site not specified: Secondary | ICD-10-CM

## 2017-04-02 DIAGNOSIS — T83511S Infection and inflammatory reaction due to indwelling urethral catheter, sequela: Secondary | ICD-10-CM

## 2017-04-02 DIAGNOSIS — R82998 Other abnormal findings in urine: Secondary | ICD-10-CM

## 2017-04-02 MED FILL — METOPROLOL TARTRATE 25 MG T: 25 | 30 days supply | Qty: 30 | Fill #1

## 2017-04-02 MED FILL — NORTRIPTYLINE HCL 75 MG CAP: 75 | 30 days supply | Qty: 30 | Fill #1

## 2017-04-02 MED FILL — METHOCARBAMOL 750 MG TABLET: 750 | 7 days supply | Qty: 30 | Fill #0

## 2017-04-02 NOTE — Telephone Encounter (Signed)
[facility] called to get orders to continue to see Craig Calderon 2wk4 .  Approval given.

## 2017-04-02 NOTE — Telephone Encounter (Signed)
CMA call regarding echo results  Patient mom verify DOB   Patient was aware and understood

## 2017-04-02 NOTE — Telephone Encounter (Signed)
-----   Message from Lizbeth BarkMandesia R Hairston, [place] sent at 04/02/2017  2:56 PM EDT ----- -Echocardiogram which looks at your heart structure and functions showed moderate enlargement of the left ventricle chamber of the heart. This can occur overtime when blood pressure is not well controlled. Continue to take medications for blood pressure. Quit smoking.  Hearts ability to pump is normal. Follow up with cardiology referral.

## 2017-04-06 ENCOUNTER — Other Ambulatory Visit: Payer: Self-pay | Admitting: Family Medicine

## 2017-04-06 DIAGNOSIS — R82998 Other abnormal findings in urine: Secondary | ICD-10-CM

## 2017-04-06 DIAGNOSIS — T83511S Infection and inflammatory reaction due to indwelling urethral catheter, sequela: Secondary | ICD-10-CM

## 2017-04-06 DIAGNOSIS — N390 Urinary tract infection, site not specified: Secondary | ICD-10-CM

## 2017-04-09 ENCOUNTER — Encounter (HOSPITAL_COMMUNITY): Payer: Self-pay | Admitting: Emergency Medicine

## 2017-04-09 ENCOUNTER — Emergency Department (HOSPITAL_COMMUNITY): Payer: Medicaid Other

## 2017-04-09 ENCOUNTER — Inpatient Hospital Stay (HOSPITAL_COMMUNITY)
Admission: EM | Admit: 2017-04-09 | Discharge: 2017-04-13 | DRG: 871 | Disposition: A | Discharge status: Home / Self Care | Payer: Medicaid Other | Attending: Family Medicine | Admitting: Family Medicine

## 2017-04-09 DIAGNOSIS — G546 Phantom limb syndrome with pain: Secondary | ICD-10-CM | POA: Diagnosis present

## 2017-04-09 DIAGNOSIS — I10 Essential (primary) hypertension: Secondary | ICD-10-CM | POA: Diagnosis present

## 2017-04-09 DIAGNOSIS — F4320 Adjustment disorder, unspecified: Secondary | ICD-10-CM | POA: Diagnosis present

## 2017-04-09 DIAGNOSIS — F329 Major depressive disorder, single episode, unspecified: Secondary | ICD-10-CM | POA: Diagnosis present

## 2017-04-09 DIAGNOSIS — J181 Lobar pneumonia, unspecified organism: Secondary | ICD-10-CM

## 2017-04-09 DIAGNOSIS — N390 Urinary tract infection, site not specified: Secondary | ICD-10-CM | POA: Diagnosis present

## 2017-04-09 DIAGNOSIS — G8220 Paraplegia, unspecified: Secondary | ICD-10-CM | POA: Diagnosis present

## 2017-04-09 DIAGNOSIS — L8992 Pressure ulcer of unspecified site, stage 2: Secondary | ICD-10-CM | POA: Diagnosis present

## 2017-04-09 DIAGNOSIS — Z9081 Acquired absence of spleen: Secondary | ICD-10-CM

## 2017-04-09 DIAGNOSIS — N319 Neuromuscular dysfunction of bladder, unspecified: Secondary | ICD-10-CM | POA: Diagnosis present

## 2017-04-09 DIAGNOSIS — A419 Sepsis, unspecified organism: Principal | ICD-10-CM | POA: Diagnosis present

## 2017-04-09 DIAGNOSIS — F17210 Nicotine dependence, cigarettes, uncomplicated: Secondary | ICD-10-CM | POA: Diagnosis present

## 2017-04-09 DIAGNOSIS — R000 Tachycardia, unspecified: Secondary | ICD-10-CM | POA: Diagnosis present

## 2017-04-09 DIAGNOSIS — B961 Klebsiella pneumoniae [K. pneumoniae] as the cause of diseases classified elsewhere: Secondary | ICD-10-CM | POA: Diagnosis present

## 2017-04-09 DIAGNOSIS — L22 Diaper dermatitis: Secondary | ICD-10-CM | POA: Diagnosis present

## 2017-04-09 DIAGNOSIS — Y95 Nosocomial condition: Secondary | ICD-10-CM | POA: Diagnosis present

## 2017-04-09 DIAGNOSIS — L89152 Pressure ulcer of sacral region, stage 2: Secondary | ICD-10-CM | POA: Diagnosis present

## 2017-04-09 DIAGNOSIS — J189 Pneumonia, unspecified organism: Secondary | ICD-10-CM | POA: Diagnosis present

## 2017-04-09 DIAGNOSIS — M792 Neuralgia and neuritis, unspecified: Secondary | ICD-10-CM | POA: Diagnosis present

## 2017-04-09 LAB — URINALYSIS, ROUTINE W REFLEX MICROSCOPIC
Bacteria, UA: NONE SEEN
Bilirubin Urine: NEGATIVE
Glucose, UA: NEGATIVE mg/dL
Ketones, ur: NEGATIVE mg/dL
Nitrite: POSITIVE — AB
Protein, ur: 100 mg/dL — AB
Specific Gravity, Urine: 1.016 (ref 1.005–1.030)
pH: 5 (ref 5.0–8.0)

## 2017-04-09 LAB — BASIC METABOLIC PANEL
Anion gap: 9 (ref 5–15)
BUN: 13 mg/dL (ref 6–20)
CO2: 25 mmol/L (ref 22–32)
Calcium: 9.5 mg/dL (ref 8.9–10.3)
Chloride: 102 mmol/L (ref 101–111)
Creatinine, Ser: 0.72 mg/dL (ref 0.61–1.24)
GFR calc Af Amer: >60 mL/min (ref >60)
GFR calc non Af Amer: >60 mL/min (ref >60)
Glucose, Bld: 95 mg/dL (ref 65–99)
Potassium: 4.5 mmol/L (ref 3.5–5.1)
Sodium: 136 mmol/L (ref 135–145)

## 2017-04-09 LAB — CBC WITH DIFFERENTIAL/PLATELET
Basophils Absolute: 0 10*3/uL (ref 0.0–0.1)
Basophils Relative: 0 %
Eosinophils Absolute: 0.2 10*3/uL (ref 0.0–0.7)
Eosinophils Relative: 2 %
HCT: 35.6 % — ABNORMAL LOW (ref 39.0–52.0)
Hemoglobin: 12 g/dL — ABNORMAL LOW (ref 13.0–17.0)
Lymphocytes Relative: 13 %
Lymphs Abs: 1.5 10*3/uL (ref 0.7–4.0)
MCH: 26.6 pg (ref 26.0–34.0)
MCHC: 33.7 g/dL (ref 30.0–36.0)
MCV: 78.9 fL (ref 78.0–100.0)
Monocytes Absolute: 1.1 10*3/uL — ABNORMAL HIGH (ref 0.1–1.0)
Monocytes Relative: 9 %
Neutro Abs: 9.4 10*3/uL — ABNORMAL HIGH (ref 1.7–7.7)
Neutrophils Relative %: 76 %
Platelets: 529 10*3/uL — ABNORMAL HIGH (ref 150–400)
RBC: 4.51 MIL/uL (ref 4.22–5.81)
RDW: 17.1 % — ABNORMAL HIGH (ref 11.5–15.5)
WBC: 12.3 10*3/uL — ABNORMAL HIGH (ref 4.0–10.5)

## 2017-04-09 LAB — I-STAT CG4 LACTIC ACID, ED: Lactic Acid, Venous: 0.96 mmol/L (ref 0.5–1.9)

## 2017-04-09 MED ORDER — IOPAMIDOL (ISOVUE-300) INJECTION 61%
100.0000 mL | Freq: Once | INTRAVENOUS | Status: AC | PRN
  Administered 2017-04-09: 100 mL via INTRAVENOUS

## 2017-04-09 MED ORDER — SODIUM CHLORIDE 0.9 % IV BOLUS (SEPSIS)
1000.0000 mL | Freq: Once | INTRAVENOUS | Status: AC
  Administered 2017-04-09: 1000 mL via INTRAVENOUS

## 2017-04-09 MED ORDER — IOPAMIDOL (ISOVUE-300) INJECTION 61%
INTRAVENOUS | Status: AC
  Administered 2017-04-09: 100 mL via INTRAVENOUS
  Filled 2017-04-09: qty 100

## 2017-04-09 MED ORDER — LIDOCAINE HCL 2 % EX GEL
1.0000 "application " | Freq: Once | CUTANEOUS | Status: AC
  Administered 2017-04-09: 1 via URETHRAL
  Filled 2017-04-09: qty 11

## 2017-04-09 NOTE — ED Notes (Signed)
Bed: WA05 Expected date:  Expected time:  Means of arrival:  Comments: 
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
## 2017-04-09 NOTE — Telephone Encounter (Signed)
CMA call regarding medication refill is already been sent to pharmacy   Patient mother verify DOB  Patient was aware and understood

## 2017-04-10 ENCOUNTER — Encounter (HOSPITAL_COMMUNITY): Payer: Self-pay | Admitting: Family Medicine

## 2017-04-10 DIAGNOSIS — A419 Sepsis, unspecified organism: Secondary | ICD-10-CM | POA: Diagnosis present

## 2017-04-10 DIAGNOSIS — F4320 Adjustment disorder, unspecified: Secondary | ICD-10-CM | POA: Diagnosis present

## 2017-04-10 DIAGNOSIS — G546 Phantom limb syndrome with pain: Secondary | ICD-10-CM | POA: Diagnosis present

## 2017-04-10 DIAGNOSIS — Z9081 Acquired absence of spleen: Secondary | ICD-10-CM | POA: Diagnosis not present

## 2017-04-10 DIAGNOSIS — R000 Tachycardia, unspecified: Secondary | ICD-10-CM | POA: Insufficient documentation

## 2017-04-10 DIAGNOSIS — G8220 Paraplegia, unspecified: Secondary | ICD-10-CM | POA: Diagnosis present

## 2017-04-10 DIAGNOSIS — I10 Essential (primary) hypertension: Secondary | ICD-10-CM | POA: Diagnosis present

## 2017-04-10 DIAGNOSIS — Y95 Nosocomial condition: Secondary | ICD-10-CM | POA: Diagnosis present

## 2017-04-10 DIAGNOSIS — L89152 Pressure ulcer of sacral region, stage 2: Secondary | ICD-10-CM | POA: Diagnosis present

## 2017-04-10 DIAGNOSIS — J181 Lobar pneumonia, unspecified organism: Secondary | ICD-10-CM

## 2017-04-10 DIAGNOSIS — N390 Urinary tract infection, site not specified: Secondary | ICD-10-CM | POA: Diagnosis present

## 2017-04-10 DIAGNOSIS — L22 Diaper dermatitis: Secondary | ICD-10-CM | POA: Diagnosis present

## 2017-04-10 DIAGNOSIS — N319 Neuromuscular dysfunction of bladder, unspecified: Secondary | ICD-10-CM | POA: Diagnosis present

## 2017-04-10 DIAGNOSIS — J189 Pneumonia, unspecified organism: Secondary | ICD-10-CM | POA: Diagnosis present

## 2017-04-10 DIAGNOSIS — F17210 Nicotine dependence, cigarettes, uncomplicated: Secondary | ICD-10-CM | POA: Diagnosis present

## 2017-04-10 DIAGNOSIS — F329 Major depressive disorder, single episode, unspecified: Secondary | ICD-10-CM | POA: Diagnosis present

## 2017-04-10 DIAGNOSIS — L8992 Pressure ulcer of unspecified site, stage 2: Secondary | ICD-10-CM | POA: Diagnosis present

## 2017-04-10 DIAGNOSIS — B961 Klebsiella pneumoniae [K. pneumoniae] as the cause of diseases classified elsewhere: Secondary | ICD-10-CM | POA: Diagnosis present

## 2017-04-10 LAB — CBC WITH DIFFERENTIAL/PLATELET
BASOS ABS: 0 10*3/uL (ref 0.0–0.1)
BASOS PCT: 0 %
EOS ABS: 0.1 10*3/uL (ref 0.0–0.7)
EOS PCT: 1 %
HCT: 32.7 % — ABNORMAL LOW (ref 39.0–52.0)
Hemoglobin: 10.8 g/dL — ABNORMAL LOW (ref 13.0–17.0)
Lymphocytes Relative: 13 %
Lymphs Abs: 1.6 10*3/uL (ref 0.7–4.0)
MCH: 26.8 pg (ref 26.0–34.0)
MCHC: 33 g/dL (ref 30.0–36.0)
MCV: 81.1 fL (ref 78.0–100.0)
Monocytes Absolute: 0.6 10*3/uL (ref 0.1–1.0)
Monocytes Relative: 5 %
NEUTROS PCT: 81 %
Neutro Abs: 9.8 10*3/uL — ABNORMAL HIGH (ref 1.7–7.7)
PLATELETS: 684 10*3/uL — AB (ref 150–400)
RBC: 4.03 MIL/uL — AB (ref 4.22–5.81)
RDW: 17.5 % — ABNORMAL HIGH (ref 11.5–15.5)
WBC: 12.2 10*3/uL — AB (ref 4.0–10.5)

## 2017-04-10 LAB — BASIC METABOLIC PANEL
Anion gap: 12 (ref 5–15)
BUN: 11 mg/dL (ref 6–20)
CO2: 24 mmol/L (ref 22–32)
CREATININE: 0.78 mg/dL (ref 0.61–1.24)
Calcium: 9 mg/dL (ref 8.9–10.3)
Chloride: 99 mmol/L — ABNORMAL LOW (ref 101–111)
Glucose, Bld: 139 mg/dL — ABNORMAL HIGH (ref 65–99)
POTASSIUM: 3.5 mmol/L (ref 3.5–5.1)
SODIUM: 135 mmol/L (ref 135–145)

## 2017-04-10 LAB — HIV ANTIBODY (ROUTINE TESTING W REFLEX): HIV SCREEN 4TH GENERATION: NONREACTIVE

## 2017-04-10 LAB — STREP PNEUMONIAE URINARY ANTIGEN: Strep Pneumo Urinary Antigen: NEGATIVE

## 2017-04-10 LAB — PROCALCITONIN: PROCALCITONIN: 0.4 ng/mL

## 2017-04-10 MED ORDER — ENOXAPARIN SODIUM 40 MG/0.4ML ~~LOC~~ SOLN
40.0000 mg | SUBCUTANEOUS | Status: DC
  Administered 2017-04-10 – 2017-04-12 (×3): 40 mg via SUBCUTANEOUS
  Filled 2017-04-10 (×3): qty 0.4

## 2017-04-10 MED ORDER — NORTRIPTYLINE HCL 25 MG PO CAPS
75.0000 mg | ORAL_CAPSULE | Freq: Every day | ORAL | Status: DC
  Administered 2017-04-10: 75 mg via ORAL
  Filled 2017-04-10: qty 3

## 2017-04-10 MED ORDER — HYDROCERIN EX CREA
TOPICAL_CREAM | Freq: Every day | CUTANEOUS | Status: DC | PRN
  Filled 2017-04-10: qty 113

## 2017-04-10 MED ORDER — ESCITALOPRAM OXALATE 10 MG PO TABS: 5.0000 mg | ORAL_TABLET | Freq: Every day | ORAL | Status: DC

## 2017-04-10 MED ORDER — PANTOPRAZOLE SODIUM 40 MG PO TBEC
40.0000 mg | DELAYED_RELEASE_TABLET | Freq: Every day | ORAL | Status: DC
  Administered 2017-04-10 – 2017-04-13 (×4): 40 mg via ORAL
  Filled 2017-04-10 (×4): qty 1

## 2017-04-10 MED ORDER — MIRTAZAPINE 15 MG PO TABS
7.5000 mg | ORAL_TABLET | Freq: Every day | ORAL | Status: DC
  Administered 2017-04-10 – 2017-04-12 (×3): 7.5 mg via ORAL
  Filled 2017-04-10 (×3): qty 1

## 2017-04-10 MED ORDER — LEVETIRACETAM 500 MG PO TABS
500.0000 mg | ORAL_TABLET | Freq: Two times a day (BID) | ORAL | Status: DC
  Administered 2017-04-10 – 2017-04-13 (×8): 500 mg via ORAL
  Filled 2017-04-10 (×8): qty 1

## 2017-04-10 MED ORDER — NORTRIPTYLINE HCL 25 MG PO CAPS
100.0000 mg | ORAL_CAPSULE | Freq: Every day | ORAL | Status: DC
  Administered 2017-04-10 – 2017-04-12 (×3): 100 mg via ORAL
  Filled 2017-04-10 (×3): qty 4

## 2017-04-10 MED ORDER — BISACODYL 10 MG RE SUPP
10.0000 mg | Freq: Every day | RECTAL | Status: DC | PRN
Start: 2017-04-10 — End: 2017-04-13

## 2017-04-10 MED ORDER — ENSURE ENLIVE PO LIQD
237.0000 mL | Freq: Two times a day (BID) | ORAL | Status: DC
  Administered 2017-04-10 – 2017-04-13 (×5): 237 mL via ORAL

## 2017-04-10 MED ORDER — SODIUM CHLORIDE 0.9 % IV BOLUS (SEPSIS)
750.0000 mL | Freq: Once | INTRAVENOUS | Status: AC
  Administered 2017-04-10: 750 mL via INTRAVENOUS

## 2017-04-10 MED ORDER — METOPROLOL TARTRATE 25 MG PO TABS
25.0000 mg | ORAL_TABLET | Freq: Every day | ORAL | Status: DC
  Administered 2017-04-10 – 2017-04-13 (×4): 25 mg via ORAL
  Filled 2017-04-10 (×4): qty 1

## 2017-04-10 MED ORDER — LEVOFLOXACIN IN D5W 750 MG/150ML IV SOLN
750.0000 mg | Freq: Once | INTRAVENOUS | Status: AC
  Administered 2017-04-10: 750 mg via INTRAVENOUS
  Filled 2017-04-10: qty 150

## 2017-04-10 MED ORDER — SODIUM CHLORIDE 0.9 % IV SOLN
INTRAVENOUS | Status: AC
  Administered 2017-04-10: 01:00:00 via INTRAVENOUS

## 2017-04-10 MED ORDER — ACETAMINOPHEN 325 MG PO TABS: 650.0000 mg | ORAL_TABLET | Freq: Four times a day (QID) | ORAL | Status: DC | PRN

## 2017-04-10 MED ORDER — METHOCARBAMOL 500 MG PO TABS
750.0000 mg | ORAL_TABLET | Freq: Four times a day (QID) | ORAL | Status: DC | PRN
  Administered 2017-04-10 – 2017-04-12 (×6): 750 mg via ORAL
  Filled 2017-04-10 (×6): qty 2

## 2017-04-10 MED ORDER — VANCOMYCIN HCL IN DEXTROSE 1-5 GM/200ML-% IV SOLN
1000.0000 mg | Freq: Three times a day (TID) | INTRAVENOUS | Status: DC
  Administered 2017-04-10 – 2017-04-12 (×8): 1000 mg via INTRAVENOUS
  Filled 2017-04-10 (×8): qty 200

## 2017-04-10 MED ORDER — OXYCODONE HCL 5 MG PO TABS
10.0000 mg | ORAL_TABLET | Freq: Four times a day (QID) | ORAL | Status: DC | PRN
  Administered 2017-04-10 – 2017-04-12 (×7): 10 mg via ORAL
  Filled 2017-04-10 (×7): qty 2

## 2017-04-10 MED ORDER — SENNA 8.6 MG PO TABS
2.0000 | ORAL_TABLET | Freq: Every morning | ORAL | Status: DC
  Administered 2017-04-10 – 2017-04-13 (×4): 17.2 mg via ORAL
  Filled 2017-04-10 (×4): qty 2

## 2017-04-10 MED ORDER — DEXTROSE 5 % IV SOLN
1.0000 g | Freq: Three times a day (TID) | INTRAVENOUS | Status: DC
  Administered 2017-04-10 – 2017-04-13 (×11): 1 g via INTRAVENOUS
  Filled 2017-04-10 (×12): qty 1
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
## 2017-04-10 NOTE — Progress Notes (Signed)
Pharmacy Antibiotic Note  Craig Calderon is a 26 y.o. male admitted on 04/09/2017 with pneumonia.  Pharmacy has been consulted for vancomycin dosing.  Plan: Vancomycin 1 Gm IV q8h VT=15-20 mg/L Cefepime 1 Gm IV q8h  F/u scr/cultures/levels as needed  Height: 6\' 1"  (185.4 cm) Weight: 210 lb (95.3 kg) IBW/kg (Calculated) : 79.9  Temp (24hrs), Avg:100.5 F (38.1 C), Min:99.9 F (37.7 C), Max:101 F (38.3 C)   Recent Labs Lab 04/09/17 2228 04/09/17 2242  WBC 12.3*  --   CREATININE 0.72  --   LATICACIDVEN  --  0.96    Estimated Creatinine Clearance: 159.5 mL/min (by C-G formula based on SCr of 0.72 mg/dL).    No Known Allergies  Antimicrobials this admission: 8/6 levaquin >> x1 ED 8/7 cefepime >>  8/7 vancomycin >>  Dose adjustments this admission:   Microbiology results:  BCx:   UCx:    Sputum:    MRSA PCR:   Thank you for allowing pharmacy to be a part of this patient's care.  Lorenza EvangelistGreen, Saleem Coccia R 04/10/2017 12:59 AM

## 2017-04-10 NOTE — ED Notes (Signed)
Bed: [id number] Expected date:  Expected time:  Means of arrival:  Comments: Room 5

## 2017-04-10 NOTE — Progress Notes (Signed)
[facility]  Patient Status: Active (receiving services up to time of hospitalization)  [facility] is providing the following services: RN and PT  If patient discharges after hours, please call [phone].   Craig EchevariaKaren Nussbaum 04/10/2017, 10:14 AM

## 2017-04-10 NOTE — Progress Notes (Signed)
RN may call report to Dorene SorrowJerry [phone] at 01:35

## 2017-04-10 NOTE — ED Notes (Signed)
Pt transported to the floor.

## 2017-04-10 NOTE — Consult Note (Signed)
WOC Nurse wound consult note Reason for Consult: Patient with moisture associated skin damage, specifically incontinence associated dermatitis with areas of partial thickness skin loss.  Note, these are not pressure injuries, but rather MASD. They are already resurfacing/reepithelializing. Wound type:Moisture Pressure Injury POA:N/A Measurement: N/A Wound bed:N/A Drainage (amount, consistency, odor) None Periwound: Area on the bilateral buttocks is warm and moist with perspiration.  Patient prefers the supine position to watch television, but understands the risks of prolonged positioning in this position to his skin. He agrees to use of Network engineerrevalon Boots after we moisturize his dry legs and heels, also to use of moisture barrier cream (Critic Aid Clear) along with turning and repositioning from side to side (except for meals) while in house to prevent pressure injury and to allow skin air exposure. Dressing procedure/placement/frequency: Patient does not require a mattress replacement with low air loss feature at this time, but I will remain available if staff have difficulty in managing skin microclimate with turning and repositioning-especially with consideration for HOB elevation. Guidance is provided for skin care products and the provision of pressure redistribution heel boots. I discuss with patient's bedside RN today, Tama GanderSophia Pickett and all are in agreement with POC updates. WOC nursing team will not follow, but will remain available to this patient, the nursing and medical teams.  Please re-consult if needed. Thanks, Ladona MowLaurie Cera Rorke, MSN, RN, GNP, Hans EdenCWOCN, CWON-AP, FAAN  Pager# [phone]
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
## 2017-04-10 NOTE — ED Notes (Signed)
Attempted to call report Fredia SorrowGerry RN unavailable to take report gave floor my contact info and waiting return call.

## 2017-04-11 DIAGNOSIS — J189 Pneumonia, unspecified organism: Secondary | ICD-10-CM

## 2017-04-11 DIAGNOSIS — I10 Essential (primary) hypertension: Secondary | ICD-10-CM

## 2017-04-11 DIAGNOSIS — N390 Urinary tract infection, site not specified: Secondary | ICD-10-CM

## 2017-04-11 DIAGNOSIS — G8220 Paraplegia, unspecified: Secondary | ICD-10-CM

## 2017-04-11 LAB — CREATININE, SERUM
Creatinine, Ser: 0.64 mg/dL (ref 0.61–1.24)
GFR calc Af Amer: >60 mL/min (ref >60)
GFR calc non Af Amer: >60 mL/min (ref >60)

## 2017-04-11 LAB — MRSA PCR SCREENING: MRSA BY PCR: NEGATIVE

## 2017-04-11 LAB — LEGIONELLA PNEUMOPHILA SEROGP 1 UR AG: L. pneumophila Serogp 1 Ur Ag: NEGATIVE

## 2017-04-11 MED ORDER — TAMSULOSIN HCL 0.4 MG PO CAPS
0.4000 mg | ORAL_CAPSULE | Freq: Every day | ORAL | Status: DC
  Administered 2017-04-11 – 2017-04-13 (×3): 0.4 mg via ORAL
  Filled 2017-04-11 (×3): qty 1
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
## 2017-04-12 ENCOUNTER — Other Ambulatory Visit: Payer: Self-pay

## 2017-04-12 DIAGNOSIS — M792 Neuralgia and neuritis, unspecified: Secondary | ICD-10-CM

## 2017-04-12 LAB — BASIC METABOLIC PANEL
ANION GAP: 8 (ref 5–15)
BUN: 8 mg/dL (ref 6–20)
CO2: 27 mmol/L (ref 22–32)
Calcium: 9.3 mg/dL (ref 8.9–10.3)
Chloride: 101 mmol/L (ref 101–111)
Creatinine, Ser: 0.6 mg/dL — ABNORMAL LOW (ref 0.61–1.24)
GLUCOSE: 98 mg/dL (ref 65–99)
POTASSIUM: 4 mmol/L (ref 3.5–5.1)
Sodium: 136 mmol/L (ref 135–145)

## 2017-04-12 LAB — PROCALCITONIN: PROCALCITONIN: 0.57 ng/mL

## 2017-04-12 LAB — CBC
HEMATOCRIT: 32.8 % — AB (ref 39.0–52.0)
HEMOGLOBIN: 10.5 g/dL — AB (ref 13.0–17.0)
MCH: 25.7 pg — ABNORMAL LOW (ref 26.0–34.0)
MCHC: 32 g/dL (ref 30.0–36.0)
MCV: 80.4 fL (ref 78.0–100.0)
Platelets: 522 10*3/uL — ABNORMAL HIGH (ref 150–400)
RBC: 4.08 MIL/uL — AB (ref 4.22–5.81)
RDW: 17.4 % — ABNORMAL HIGH (ref 11.5–15.5)
WBC: 9.1 10*3/uL (ref 4.0–10.5)
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
## 2017-04-13 LAB — URINE CULTURE: Culture: >=100000 — AB

## 2017-04-13 MED ORDER — LEVOFLOXACIN 750 MG PO TABS: 750.0000 mg | ORAL_TABLET | Freq: Every day | ORAL | 0 refills | Status: AC

## 2017-04-13 MED ORDER — MIRTAZAPINE 7.5 MG PO TABS: 7.5000 mg | ORAL_TABLET | Freq: Every day | ORAL | 0 refills | Status: DC

## 2017-04-13 MED ORDER — NORTRIPTYLINE HCL 50 MG PO CAPS: 100.0000 mg | ORAL_CAPSULE | Freq: Every day | ORAL | 0 refills | Status: DC

## 2017-04-13 MED ORDER — TAMSULOSIN HCL 0.4 MG PO CAPS: 0.4000 mg | ORAL_CAPSULE | Freq: Every day | ORAL | 0 refills | Status: AC

## 2017-04-13 MED FILL — NORTRIPTYLINE HCL 50 MG CAP: 50 | 30 days supply | Qty: 60 | Fill #0

## 2017-04-13 MED FILL — MIRTAZAPINE 7.5 MG TABLET: 7.5 | 30 days supply | Qty: 30 | Fill #0

## 2017-04-13 MED FILL — levoFLOXacin 750 MG TABS: 750 | 4 days supply | Qty: 4 | Fill #0

## 2017-04-13 MED FILL — TAMSULOSIN HCL 0.4 MG CAP: 0.4 | 30 days supply | Qty: 30 | Fill #0

## 2017-04-13 NOTE — Progress Notes (Signed)
Spoke with pt's mother concerning discharge needs and transportation. [name] states pt was active with [facility] and she will transport him home. Please call mother when pt is ready for discharge.
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
## 2017-04-13 NOTE — Progress Notes (Signed)
Patient remains a&ox4, wheelchair use w/ sliding board. Pts mother at bedside. Discharge instructions reviewed. Questions concerns denied.

## 2017-04-15 LAB — CULTURE, BLOOD (ROUTINE X 2)
CULTURE: NO GROWTH
CULTURE: NO GROWTH
SPECIAL REQUESTS: ADEQUATE
Special Requests: ADEQUATE

## 2017-04-16 ENCOUNTER — Telehealth: Payer: Self-pay | Admitting: Physical Medicine & Rehabilitation

## 2017-04-16 NOTE — Telephone Encounter (Signed)
Alexis RN with [facility] needs to get resumption of care orders for 1 week only.  She will need to do recert next week.  Please call her at [phone].

## 2017-04-16 NOTE — Telephone Encounter (Signed)
He has been admitted and discharged since our discharge so we would not be the signing physician on this order. Alexis notified and agrees.

## 2017-04-17 ENCOUNTER — Telehealth: Payer: Self-pay | Admitting: Family Medicine

## 2017-04-17 NOTE — Telephone Encounter (Signed)
[facility] called requesting orders to continue to see the pt.  Please f/u with pt.

## 2017-04-17 NOTE — Telephone Encounter (Signed)
[facility] to continue to see [facility] pt.  Please advice?

## 2017-04-18 ENCOUNTER — Telehealth: Payer: Self-pay | Admitting: *Deleted

## 2017-04-18 NOTE — Telephone Encounter (Signed)
Amber, PT, [facility] left a message asking for verbal orders to continue  Home PT.  I was informed by Gillis EndsSybil that since the patient was recently discharged under the care of another physician they should be providing any new orders regarding home health as indicated by their discharge summary.  I contacted Amber back and explained the situation. She understood. Advised her to call Dr. Raynelle DickGrunz's office

## 2017-04-18 NOTE — Telephone Encounter (Signed)
Amber, PT, [facility],  left a message informing that patient is suffering from major depression following his recent discharge from the hospital. He has an upcoming appt with Dr. Riley KillSwartz on 05/01/2017....Marland Kitchen.fyi

## 2017-04-19 ENCOUNTER — Other Ambulatory Visit: Payer: Self-pay | Admitting: Family Medicine

## 2017-04-19 ENCOUNTER — Telehealth: Payer: Self-pay

## 2017-04-19 NOTE — Telephone Encounter (Signed)
Amber from Physical Therapy called and was wanting verbal orders for PT since patient is home from the hospital. Gave verbals orders for physical therapy

## 2017-04-20 ENCOUNTER — Other Ambulatory Visit: Payer: Self-pay | Admitting: Family Medicine

## 2017-04-20 DIAGNOSIS — S24104S Unspecified injury at T11-T12 level of thoracic spinal cord, sequela: Secondary | ICD-10-CM

## 2017-04-20 MED ORDER — METHOCARBAMOL 750 MG PO TABS: ORAL_TABLET | ORAL | 0 refills | Status: DC

## 2017-04-20 MED FILL — METHOCARBAMOL 750 MG TABLET: 750 | 7 days supply | Qty: 30 | Fill #0

## 2017-04-23 ENCOUNTER — Telehealth: Payer: Self-pay | Admitting: *Deleted

## 2017-04-23 NOTE — Telephone Encounter (Signed)
Jon Gills, nurse with [facility] called for verbal  recert orders for 2 months.

## 2017-04-24 ENCOUNTER — Telehealth: Payer: Self-pay | Admitting: Family Medicine

## 2017-04-24 NOTE — Telephone Encounter (Signed)
Craig Calderon for [facility] [phone]  He urine is dark and possible an UTI but need the order to be check *stage 2 pressure bottom *bump on his private part also need to if he need to schedule an appt for that Please call her back urgent

## 2017-04-25 ENCOUNTER — Telehealth: Payer: Self-pay

## 2017-04-25 ENCOUNTER — Encounter (HOSPITAL_COMMUNITY): Payer: Self-pay | Admitting: Nurse Practitioner

## 2017-04-25 ENCOUNTER — Encounter: Payer: Self-pay | Admitting: Cardiology

## 2017-04-25 ENCOUNTER — Ambulatory Visit (INDEPENDENT_AMBULATORY_CARE_PROVIDER_SITE_OTHER): Payer: Medicaid Other | Admitting: Cardiology

## 2017-04-25 ENCOUNTER — Emergency Department (HOSPITAL_COMMUNITY)
Admission: EM | Admit: 2017-04-25 | Discharge: 2017-04-26 | Disposition: A | Discharge status: Home / Self Care | Payer: Medicaid Other | Attending: Emergency Medicine | Admitting: Emergency Medicine

## 2017-04-25 VITALS — BP 122/60 | HR 116 | Ht 73.0 in

## 2017-04-25 DIAGNOSIS — N390 Urinary tract infection, site not specified: Secondary | ICD-10-CM | POA: Diagnosis not present

## 2017-04-25 DIAGNOSIS — Z79899 Other long term (current) drug therapy: Secondary | ICD-10-CM | POA: Insufficient documentation

## 2017-04-25 DIAGNOSIS — Y733 Surgical instruments, materials and gastroenterology and urology devices (including sutures) associated with adverse incidents: Secondary | ICD-10-CM | POA: Diagnosis not present

## 2017-04-25 DIAGNOSIS — Z87828 Personal history of other (healed) physical injury and trauma: Secondary | ICD-10-CM | POA: Diagnosis not present

## 2017-04-25 DIAGNOSIS — G8220 Paraplegia, unspecified: Secondary | ICD-10-CM | POA: Insufficient documentation

## 2017-04-25 DIAGNOSIS — R000 Tachycardia, unspecified: Secondary | ICD-10-CM

## 2017-04-25 DIAGNOSIS — F17210 Nicotine dependence, cigarettes, uncomplicated: Secondary | ICD-10-CM | POA: Diagnosis not present

## 2017-04-25 DIAGNOSIS — Z466 Encounter for fitting and adjustment of urinary device: Secondary | ICD-10-CM | POA: Diagnosis present

## 2017-04-25 DIAGNOSIS — I10 Essential (primary) hypertension: Secondary | ICD-10-CM | POA: Diagnosis not present

## 2017-04-25 DIAGNOSIS — T83511A Infection and inflammatory reaction due to indwelling urethral catheter, initial encounter: Secondary | ICD-10-CM | POA: Insufficient documentation

## 2017-04-25 LAB — CBC WITH DIFFERENTIAL/PLATELET
Basophils Absolute: 0 10*3/uL (ref 0.0–0.1)
Basophils Relative: 0 %
EOS ABS: 0.3 10*3/uL (ref 0.0–0.7)
EOS PCT: 3 %
HCT: 35.6 % — ABNORMAL LOW (ref 39.0–52.0)
Hemoglobin: 11.7 g/dL — ABNORMAL LOW (ref 13.0–17.0)
LYMPHS ABS: 2.5 10*3/uL (ref 0.7–4.0)
Lymphocytes Relative: 22 %
MCH: 26.5 pg (ref 26.0–34.0)
MCHC: 32.9 g/dL (ref 30.0–36.0)
MCV: 80.7 fL (ref 78.0–100.0)
MONO ABS: 0.8 10*3/uL (ref 0.1–1.0)
MONOS PCT: 7 %
Neutro Abs: 7.3 10*3/uL (ref 1.7–7.7)
Neutrophils Relative %: 68 %
PLATELETS: 603 10*3/uL — AB (ref 150–400)
RBC: 4.41 MIL/uL (ref 4.22–5.81)
RDW: 18.5 % — AB (ref 11.5–15.5)
WBC: 10.9 10*3/uL — AB (ref 4.0–10.5)

## 2017-04-25 LAB — URINALYSIS, ROUTINE W REFLEX MICROSCOPIC
Bilirubin Urine: NEGATIVE
Glucose, UA: NEGATIVE mg/dL
Ketones, ur: NEGATIVE mg/dL
Nitrite: NEGATIVE
PH: 5 (ref 5.0–8.0)
PROTEIN: 100 mg/dL — AB
Specific Gravity, Urine: 1.024 (ref 1.005–1.030)
Squamous Epithelial / LPF: NONE SEEN

## 2017-04-25 LAB — I-STAT CHEM 8, ED
BUN: 13 mg/dL (ref 6–20)
CALCIUM ION: 1.14 mmol/L — AB (ref 1.15–1.40)
CHLORIDE: 104 mmol/L (ref 101–111)
CREATININE: 0.6 mg/dL — AB (ref 0.61–1.24)
GLUCOSE: 115 mg/dL — AB (ref 65–99)
HCT: 35 % — ABNORMAL LOW (ref 39.0–52.0)
Hemoglobin: 11.9 g/dL — ABNORMAL LOW (ref 13.0–17.0)
Potassium: 4 mmol/L (ref 3.5–5.1)
Sodium: 140 mmol/L (ref 135–145)
TCO2: 26 mmol/L (ref 0–100)

## 2017-04-25 LAB — T4, FREE: FREE T4: 1.23 ng/dL (ref 0.82–1.77)

## 2017-04-25 LAB — T3, FREE: T3 FREE: 3.1 pg/mL (ref 2.0–4.4)

## 2017-04-25 LAB — TSH: TSH: 1.87 u[IU]/mL (ref 0.450–4.500)

## 2017-04-25 MED ORDER — DEXTROSE 5 % IV SOLN
1.0000 g | Freq: Once | INTRAVENOUS | Status: AC
  Administered 2017-04-25: 1 g via INTRAVENOUS
  Filled 2017-04-25: qty 10

## 2017-04-25 MED ORDER — SODIUM CHLORIDE 0.9 % IV BOLUS (SEPSIS)
1000.0000 mL | Freq: Once | INTRAVENOUS | Status: AC
  Administered 2017-04-25: 1000 mL via INTRAVENOUS

## 2017-04-25 MED ORDER — METHOCARBAMOL 500 MG PO TABS
750.0000 mg | ORAL_TABLET | Freq: Once | ORAL | Status: AC
  Administered 2017-04-25: 750 mg via ORAL
  Filled 2017-04-25: qty 2

## 2017-04-25 MED ORDER — OXYCODONE HCL 5 MG PO TABS
10.0000 mg | ORAL_TABLET | Freq: Once | ORAL | Status: AC
  Administered 2017-04-25: 10 mg via ORAL
  Filled 2017-04-25: qty 2

## 2017-04-25 MED ORDER — CEPHALEXIN 500 MG PO CAPS: 500.0000 mg | ORAL_CAPSULE | Freq: Two times a day (BID) | ORAL | 0 refills | Status: AC

## 2017-04-25 MED ORDER — NADOLOL 20 MG PO TABS: 20.0000 mg | ORAL_TABLET | Freq: Every day | ORAL | 1 refills | Status: DC

## 2017-04-25 NOTE — ED Triage Notes (Signed)
Patient is paraplegic and has a chronic foley catheter. Patient catheter started leaking today around his penis.
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
## 2017-04-25 NOTE — Discharge Instructions (Signed)
You were treated today for a urinary tract infection related to your catheter. He was treated with an IV antibiotic. You are prescribed an antibiotic, Keflex, for this infection.  Please return for any worsening signs or symptoms such as fever, nausea/vomiting, obstruction of the catheter, frank blood coming out of the catheter.  Seek evaluation for allergic symptoms related to taking this medication like rash; hives; itching; shortness of breath; wheezing; cough; swelling of face, lips, tongue, or throat; or any other signs.  Thank you for allowing [place] to participate in your care today.

## 2017-04-25 NOTE — Telephone Encounter (Signed)
Prior auth for Nadolol 20mg  started through [employer]. He must try and fail 2 alternatives, but they will send it for clinical review. Local pharmacy notified.

## 2017-04-25 NOTE — Telephone Encounter (Signed)
Faxed paperwork was received from [facility] regarding orders. Orders were signed and faxed. 
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
## 2017-04-25 NOTE — Telephone Encounter (Signed)
Faxed paperwork was received from [facility] regarding orders. Orders were signed and faxed.

## 2017-04-25 NOTE — Telephone Encounter (Signed)
Jon Gills for [facility] [phone]  He urine is dark and possible an UTI but need the order to be check *stage 2 pressure bottom *bump on his private part also need to if he need to schedule an appt for that Please call her back Urgent please advice

## 2017-04-25 NOTE — Telephone Encounter (Signed)
Called Alexis to follow up. Verbal orders given. She reports she will fax paperwork for provider to sign. History of frequent UTI, chronic indwelling foley, and kidney surgeries in the past. Recommend follow up with his urologist. Was informed that patient has not followed up with his psych referrals. Will ask LCSW  to follow up.

## 2017-04-25 NOTE — ED Notes (Signed)
Writer tried x1 to collect labs was unsuccessful

## 2017-04-25 NOTE — Patient Instructions (Signed)
Medication Instructions: - Your physician has recommended you make the following change in your medication:  1) STOP metoprolol  2) START nadolol 20 mg- take 1 tablet by mouth ONCE DAILY  Labwork: - Your physician recommends that you have lab work today: TSH/ Free T3/ Free T4  Procedures/Testing: - none ordered  Follow-Up: - We will see you back on an as needed basis.   Any Additional Special Instructions Will Be Listed Below (If Applicable).     If you need a refill on your cardiac medications before your next appointment, please call your pharmacy.

## 2017-04-26 ENCOUNTER — Telehealth: Payer: Self-pay | Admitting: *Deleted

## 2017-04-26 MED FILL — CEPHALEXIN 500 MG CAPSULE: 500 | 10 days supply | Qty: 20 | Fill #0

## 2017-04-26 NOTE — Telephone Encounter (Signed)
LCSWA will follow-up with pt regarding psych referrals at his next scheduled appointment on 04/27/17 with PCP.

## 2017-04-26 NOTE — Telephone Encounter (Signed)
A Prior Authorization for medication NADOLOL sent earlier to [facility]/medicaid.  I called [employer] today, they DENIED payment for NADOLOL because patient hasn't tried and failed two preferred beta blockers. Review of medical records  patient was on metoprolol tartrate prior to nadolol.   Preferred list of medications: Atenolol Carvedilol propranolol Labetalol Metoprolol tartate sorine sotalol  Routed to [employer] PA

## 2017-04-26 NOTE — ED Notes (Signed)
[facility] called for transfer 

## 2017-04-26 NOTE — Telephone Encounter (Signed)
-----   Message from Allayne Butcher, [place] sent at 04/25/2017  5:09 PM EDT ----- Thyroid function is normal. Continue with plans to start nadolol for HR. Stop metoprolol. Keep f/u with PCP.

## 2017-04-27 ENCOUNTER — Encounter: Payer: Self-pay | Admitting: Family Medicine

## 2017-04-27 ENCOUNTER — Ambulatory Visit: Payer: Medicaid Other | Attending: Family Medicine | Admitting: Family Medicine

## 2017-04-27 ENCOUNTER — Telehealth: Payer: Self-pay

## 2017-04-27 ENCOUNTER — Ambulatory Visit: Payer: Medicaid Other | Admitting: Licensed Clinical Social Worker

## 2017-04-27 VITALS — BP 128/84 | HR 109 | Temp 98.3°F | Resp 18

## 2017-04-27 DIAGNOSIS — I10 Essential (primary) hypertension: Secondary | ICD-10-CM | POA: Diagnosis not present

## 2017-04-27 DIAGNOSIS — L8992 Pressure ulcer of unspecified site, stage 2: Secondary | ICD-10-CM | POA: Diagnosis not present

## 2017-04-27 DIAGNOSIS — F329 Major depressive disorder, single episode, unspecified: Secondary | ICD-10-CM | POA: Insufficient documentation

## 2017-04-27 DIAGNOSIS — S24104S Unspecified injury at T11-T12 level of thoracic spinal cord, sequela: Secondary | ICD-10-CM

## 2017-04-27 DIAGNOSIS — G4700 Insomnia, unspecified: Secondary | ICD-10-CM | POA: Insufficient documentation

## 2017-04-27 DIAGNOSIS — Z87440 Personal history of urinary (tract) infections: Secondary | ICD-10-CM | POA: Diagnosis not present

## 2017-04-27 DIAGNOSIS — S24104A Unspecified injury at T11-T12 level of thoracic spinal cord, initial encounter: Secondary | ICD-10-CM | POA: Insufficient documentation

## 2017-04-27 DIAGNOSIS — Z79899 Other long term (current) drug therapy: Secondary | ICD-10-CM | POA: Diagnosis not present

## 2017-04-27 DIAGNOSIS — L89152 Pressure ulcer of sacral region, stage 2: Secondary | ICD-10-CM

## 2017-04-27 MED ORDER — NADOLOL 20 MG PO TABS: 20.0000 mg | ORAL_TABLET | Freq: Every day | ORAL | 2 refills | Status: AC

## 2017-04-27 MED FILL — NADOLOL 20 MG TAB: 20 | 30 days supply | Qty: 30 | Fill #0 | Status: TO
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
## 2017-04-27 NOTE — Progress Notes (Signed)
Patient is here for f/up   Patient complains leg pain

## 2017-04-27 NOTE — Patient Instructions (Signed)
Nadolol prescription sent to [facility]   Hypertension Hypertension is another name for high blood pressure. High blood pressure forces your heart to work harder to pump blood. This can cause problems over time. There are two numbers in a blood pressure reading. There is a top number (systolic) over a bottom number (diastolic). It is best to have a blood pressure below 120/80. Healthy choices can help lower your blood pressure. You may need medicine to help lower your blood pressure if:  Your blood pressure cannot be lowered with healthy choices.  Your blood pressure is higher than 130/80.  Follow these instructions at home: Eating and drinking  If directed, follow the DASH eating plan. This diet includes: ? Filling half of your plate at each meal with fruits and vegetables. ? Filling one quarter of your plate at each meal with whole grains. Whole grains include whole wheat pasta, brown rice, and whole grain bread. ? Eating or drinking low-fat dairy products, such as skim milk or low-fat yogurt. ? Filling one quarter of your plate at each meal with low-fat (lean) proteins. Low-fat proteins include fish, skinless chicken, eggs, beans, and tofu. ? Avoiding fatty meat, cured and processed meat, or chicken with skin. ? Avoiding premade or processed food.  Eat less than 1,500 mg of salt (sodium) a day.  Limit alcohol use to no more than 1 drink a day for nonpregnant women and 2 drinks a day for men. One drink equals 12 oz of beer, 5 oz of wine, or 1 oz of hard liquor. Lifestyle  Work with your doctor to stay at a healthy weight or to lose weight. Ask your doctor what the best weight is for you.  Get at least 30 minutes of exercise that causes your heart to beat faster (aerobic exercise) most days of the week. This may include walking, swimming, or biking.  Get at least 30 minutes of exercise that strengthens your muscles (resistance exercise) at least 3 days a  week. This may include lifting weights or pilates.  Do not use any products that contain nicotine or tobacco. This includes cigarettes and e-cigarettes. If you need help quitting, ask your doctor.  Check your blood pressure at home as told by your doctor.  Keep all follow-up visits as told by your doctor. This is important. Medicines  Take over-the-counter and prescription medicines only as told by your doctor. Follow directions carefully.  Do not skip doses of blood pressure medicine. The medicine does not work as well if you skip doses. Skipping doses also puts you at risk for problems.  Ask your doctor about side effects or reactions to medicines that you should watch for. Contact a doctor if:  You think you are having a reaction to the medicine you are taking.  You have headaches that keep coming back (recurring).  You feel dizzy.  You have swelling in your ankles.  You have trouble with your vision. Get help right away if:  You get a very bad headache.  You start to feel confused.  You feel weak or numb.  You feel faint.  You get very bad pain in your: ? Chest. ? Belly (abdomen).  You throw up (vomit) more than once.  You have trouble breathing. Summary  Hypertension is another name for high blood pressure.  Making healthy choices can help lower blood pressure. If your blood pressure cannot be controlled with healthy choices, you may need to take medicine. This information is not  intended to replace advice given to you by your health care provider. Make sure you discuss any questions you have with your health care provider. Document Released: 02/07/2008 Document Revised: 07/19/2016 Document Reviewed: 07/19/2016 Elsevier Interactive Patient Education  [employer].

## 2017-04-27 NOTE — Telephone Encounter (Addendum)
Met with the patient and his mother when he was in the clinic for his appointment. SCAT application completed and faxed to SCAT eligibility. Also provided his mother with the contact # for DSS transportation and encouraged her to call and register him for transportation. His mother stated that he now has medicaid. Discussed PCS services and he is agreeable. His mother also explained that the nurse from [facility] comes weekly to check wound healing and he is also now going to receive a home health aide weekly. His mother noted that she has been doing the wound care. The patient was very quiet when meeting with the patient and his mother.  He deferred to his mother to do the talking and to write/sign documents for him.Christa See, LCSW met with the patient and his mother at the same time as this CM    Call placed to [facility], spoke to the patient's nurse, Ann Lions, RN. She explained that she is seeing the patient weekly for assessment/teaching and monitoring wound. They are using a hydrocolloid dressing to the lower back/buttocks area  that is changed weekly and as needed and his mother has been instructed how to change the dressing.  She reported that it is a shallow stage 2 wound with no drainage. She also  reported that it has healed in the past and then re-opened as he doesn't turn or get out of bed on a regular basis. She said that he also will not lay on his left side. Ubaldo Glassing also reported a " bump" on the patient's penis, which has been reported to Fredia Beets, [place].
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
## 2017-04-30 NOTE — Telephone Encounter (Signed)
Fax received from [employer] requesting additional information.   That information was provided and application re-faxed to [employer].

## 2017-04-30 NOTE — Telephone Encounter (Addendum)
Completed PCS form faxed to [facility] 

## 2017-05-01 ENCOUNTER — Encounter: Payer: Self-pay | Admitting: Physical Medicine & Rehabilitation

## 2017-05-01 ENCOUNTER — Encounter: Payer: Medicaid Other | Attending: Physical Medicine & Rehabilitation | Admitting: Physical Medicine & Rehabilitation

## 2017-05-01 VITALS — BP 109/76 | HR 111

## 2017-05-01 DIAGNOSIS — F4310 Post-traumatic stress disorder, unspecified: Secondary | ICD-10-CM | POA: Diagnosis not present

## 2017-05-01 DIAGNOSIS — Z9889 Other specified postprocedural states: Secondary | ICD-10-CM | POA: Diagnosis not present

## 2017-05-01 DIAGNOSIS — L988 Other specified disorders of the skin and subcutaneous tissue: Secondary | ICD-10-CM | POA: Diagnosis present

## 2017-05-01 DIAGNOSIS — F17210 Nicotine dependence, cigarettes, uncomplicated: Secondary | ICD-10-CM | POA: Insufficient documentation

## 2017-05-01 DIAGNOSIS — X58XXXS Exposure to other specified factors, sequela: Secondary | ICD-10-CM | POA: Insufficient documentation

## 2017-05-01 DIAGNOSIS — M792 Neuralgia and neuritis, unspecified: Secondary | ICD-10-CM | POA: Diagnosis not present

## 2017-05-01 DIAGNOSIS — F329 Major depressive disorder, single episode, unspecified: Secondary | ICD-10-CM | POA: Diagnosis not present

## 2017-05-01 DIAGNOSIS — T148XXA Other injury of unspecified body region, initial encounter: Secondary | ICD-10-CM | POA: Diagnosis present

## 2017-05-01 DIAGNOSIS — G8220 Paraplegia, unspecified: Secondary | ICD-10-CM | POA: Diagnosis not present

## 2017-05-01 DIAGNOSIS — S24104S Unspecified injury at T11-T12 level of thoracic spinal cord, sequela: Secondary | ICD-10-CM | POA: Diagnosis not present

## 2017-05-01 DIAGNOSIS — M79604 Pain in right leg: Secondary | ICD-10-CM | POA: Insufficient documentation

## 2017-05-01 DIAGNOSIS — M79605 Pain in left leg: Secondary | ICD-10-CM | POA: Diagnosis not present

## 2017-05-01 MED ORDER — TRAMADOL HCL 50 MG PO TABS
50.0000 mg | ORAL_TABLET | Freq: Four times a day (QID) | ORAL | 1 refills | Status: DC | PRN
Start: 2017-05-01 — End: 2017-08-02

## 2017-05-01 MED ORDER — GABAPENTIN 300 MG PO CAPS: 300.0000 mg | ORAL_CAPSULE | Freq: Three times a day (TID) | ORAL | 3 refills | Status: DC

## 2017-05-01 MED ORDER — VENLAFAXINE HCL 75 MG PO TABS: 75.0000 mg | ORAL_TABLET | Freq: Two times a day (BID) | ORAL | 2 refills | Status: DC

## 2017-05-01 MED FILL — GABAPENTIN 300 MG CAPSULE: 300 | 30 days supply | Qty: 90 | Fill #0

## 2017-05-01 MED FILL — VENLAFAXINE HCL 75 MG TAB: 75 | 30 days supply | Qty: 60 | Fill #0

## 2017-05-01 NOTE — Patient Instructions (Signed)
1. DECREASE NORTRIPTYLINE TO 75MG   2. START GABAPENTIN: 300MG  AT NIGHT FOR 2 DAYS, THEN 300MG  TWICE DAILY FOR 2 DAYS THEN UP TO 300MG  THREE X DAILY  3. EFFEXOR: -START 75MG  IN MORNING FOR ONE WEEK THEN INCREASE TO 75MG  TWICE DAILY   4. STOP KEPPRA   5. SENNA-S 2-4 TABS AT BED TIME  6. CONSIDER DAILY FLEET ENEMA IF SUPPOSITORY IS STILL INEFFECTIVE  7. KEEP AN EYE ON YOUR SKIN AND URINE!!  .PLEASE FEEL FREE TO CALL OUR OFFICE WITH ANY PROBLEMS OR QUESTIONS [phone])
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
## 2017-05-02 ENCOUNTER — Telehealth: Payer: Self-pay | Admitting: Family Medicine

## 2017-05-02 NOTE — Telephone Encounter (Signed)
Call placed to [facility], #[phone] regarding PCS documents that were faxed for patient on 8/27. Spoke with [place] and he stated that the documents were received and patient has an appointment on 9/4.

## 2017-05-03 ENCOUNTER — Telehealth: Payer: Self-pay | Admitting: *Deleted

## 2017-05-03 NOTE — Telephone Encounter (Signed)
Shanna CiscoAlexis Robinson, RN, [facility] left a message to report that patients BP was 140/100, heart rate was 102 with no chest pains, no dizziness.  She also reports confusion over which beta blocker the patient is supposed to be taking.  He had been on metoprolol and was also prescribed nadalol. I spoke with Dr. Riley KillSwartz he said the patient is to take one or the other. I reported [facility] to the [facility] with Dr. Rosalyn ChartersSwartz's message. She also asked for verbal orders for a home health aide 2-3week2 to help with bathing, tidiness, and activities for daily living.  Verbal orders given. Side note: [place]. I [facility] the [facility]

## 2017-05-08 ENCOUNTER — Telehealth: Payer: Self-pay | Admitting: Family Medicine

## 2017-05-08 NOTE — Telephone Encounter (Signed)
Call placed to patient's mother #[phone], regarding SCAT application and Medicaid transportation. Spoke with [name] (mother) and she stated that Craig Calderon has his SCAT evaluation tomorrow 9/5. [facility] but will do so in the days to come. Craig Calderon is doing good.   Reminded [name] that [facility] She is aware.

## 2017-05-15 ENCOUNTER — Ambulatory Visit: Payer: Medicaid Other | Attending: Family Medicine | Admitting: Pharmacist

## 2017-05-15 VITALS — BP 122/82 | HR 82

## 2017-05-15 DIAGNOSIS — I10 Essential (primary) hypertension: Secondary | ICD-10-CM

## 2017-05-15 NOTE — Patient Instructions (Signed)
Thanks for coming to see us  Nadolol has been approved and will be covered by Medicaid  Follow up with Mandesia in 2 months - sooner if you need her.

## 2017-05-15 NOTE — Progress Notes (Signed)
   S:    Patient arrives with his mother.    Presents to the clinic for hypertension evaluation. Patient was referred by Arrie SenateMandesia Hairston on 04/27/17.  Patient was last seen by Primary Care Provider on 04/27/17.   Patient reports adherence with medications. His mother paid for his nadolol out of pocket the other day since it wasn't covered by insurance (needs prior auth)  Current BP Medications include:  Nadolol 20 mg daily.   Antihypertensives tried in the past include: metoprolol  Patient was recently discharged from hospital and all medications have been reviewed.   O:   Last 3 Office BP readings: BP Readings from Last 3 Encounters:  05/15/17 122/82  05/01/17 109/76  04/27/17 128/84   Pulse Readings from Last 3 Encounters:  05/15/17 82  05/01/17 (!) 111  04/27/17 (!) 109    BMET    Component Value Date/Time   NA 140 04/25/2017 2254   NA 138 03/06/2017 1221   K 4.0 04/25/2017 2254   CL 104 04/25/2017 2254   CO2 27 04/12/2017 0415   GLUCOSE 115 (H) 04/25/2017 2254   BUN 13 04/25/2017 2254   BUN 10 03/06/2017 1221   CREATININE 0.60 (L) 04/25/2017 2254   CALCIUM 9.3 04/12/2017 0415   GFRNONAA >60 04/12/2017 0415   GFRAA >60 04/12/2017 0415    A/P: Hypertension newly diagnosed currently controlled on current medications.  Continued nadolol - I completed the prior authorization and it was approved.    Results reviewed and written information provided.   Total time in face-to-face counseling 10 minutes.   F/U Clinic Visit with Arrie SenateMandesia Hairston in 2 months as directed.  Patient seen with Vickey HugerPreetika Sharma, PharmD Candidate

## 2017-05-17 ENCOUNTER — Other Ambulatory Visit (HOSPITAL_COMMUNITY): Payer: Self-pay | Admitting: Family Medicine

## 2017-05-17 ENCOUNTER — Other Ambulatory Visit: Payer: Self-pay | Admitting: Family Medicine

## 2017-05-17 DIAGNOSIS — S24104S Unspecified injury at T11-T12 level of thoracic spinal cord, sequela: Secondary | ICD-10-CM

## 2017-05-21 MED ORDER — METHOCARBAMOL 750 MG PO TABS: ORAL_TABLET | ORAL | 3 refills | Status: DC

## 2017-05-21 NOTE — Telephone Encounter (Signed)
Patient is requesting refill

## 2017-05-22 ENCOUNTER — Other Ambulatory Visit: Payer: Self-pay

## 2017-05-22 ENCOUNTER — Encounter: Payer: Self-pay | Admitting: Physical Medicine & Rehabilitation

## 2017-05-22 ENCOUNTER — Other Ambulatory Visit: Payer: Self-pay | Admitting: Pharmacist

## 2017-05-22 MED ORDER — NORTRIPTYLINE HCL 75 MG PO CAPS: 75.0000 mg | ORAL_CAPSULE | Freq: Every day | ORAL | 2 refills | Status: DC

## 2017-05-22 MED ORDER — METHOCARBAMOL 500 MG PO TABS: 500.0000 mg | ORAL_TABLET | Freq: Four times a day (QID) | ORAL | 3 refills | Status: DC | PRN

## 2017-05-22 MED FILL — METHOCARBAMOL 500 MG TABLET: 500 | 7 days supply | Qty: 30 | Fill #0

## 2017-05-22 MED FILL — NORTRIPTYLINE HCL 75 MG CAP: 75 | 30 days supply | Qty: 30 | Fill #0

## 2017-05-22 NOTE — Telephone Encounter (Signed)
Recieved e-mail request for a refill of the medicination called mirtazapine, no mention in last note as to fill this medication, please advise

## 2017-05-22 NOTE — Telephone Encounter (Signed)
Received fax from pharmacy - methocarbamol 750 mg is on backorder. The 500 mg strength is available. Reordered as 500 mg

## 2017-05-23 MED ORDER — MIRTAZAPINE 7.5 MG PO TABS: 7.5000 mg | ORAL_TABLET | Freq: Every day | ORAL | 2 refills | Status: AC

## 2017-05-23 MED FILL — MIRTAZAPINE 7.5 MG TABLET: 7.5 | 30 days supply | Qty: 30 | Fill #0

## 2017-05-23 NOTE — Telephone Encounter (Signed)
Med refilled.

## 2017-05-23 NOTE — Telephone Encounter (Signed)
noted 

## 2017-05-28 MED FILL — NADOLOL 20 MG TAB: 20 | 30 days supply | Qty: 30 | Fill #0

## 2017-05-29 ENCOUNTER — Telehealth: Payer: Self-pay | Admitting: *Deleted

## 2017-05-29 NOTE — Telephone Encounter (Signed)
Alexis RN [facility] called to report that Mr Haliburton is out of his Nadolol.  He has a friedn that was supposed to be coming with some money to get his refill, but for now his BP is 130/90 and HR is 126 asymptomatic at this time.  He has appt with Dr Riley Kill tomorrow (05/30/17) @ 10:40.

## 2017-05-30 ENCOUNTER — Encounter: Payer: Self-pay | Admitting: Physical Medicine & Rehabilitation

## 2017-05-30 ENCOUNTER — Encounter: Payer: Medicaid Other | Attending: Physical Medicine & Rehabilitation | Admitting: Physical Medicine & Rehabilitation

## 2017-05-30 VITALS — BP 103/68 | HR 104

## 2017-05-30 DIAGNOSIS — S24109S Unspecified injury at unspecified level of thoracic spinal cord, sequela: Secondary | ICD-10-CM | POA: Diagnosis not present

## 2017-05-30 DIAGNOSIS — I10 Essential (primary) hypertension: Secondary | ICD-10-CM | POA: Diagnosis not present

## 2017-05-30 DIAGNOSIS — G8220 Paraplegia, unspecified: Secondary | ICD-10-CM | POA: Diagnosis not present

## 2017-05-30 DIAGNOSIS — F17210 Nicotine dependence, cigarettes, uncomplicated: Secondary | ICD-10-CM | POA: Diagnosis not present

## 2017-05-30 DIAGNOSIS — M79604 Pain in right leg: Secondary | ICD-10-CM | POA: Diagnosis not present

## 2017-05-30 DIAGNOSIS — M792 Neuralgia and neuritis, unspecified: Secondary | ICD-10-CM | POA: Diagnosis not present

## 2017-05-30 DIAGNOSIS — M79605 Pain in left leg: Secondary | ICD-10-CM | POA: Diagnosis not present

## 2017-05-30 DIAGNOSIS — S24104S Unspecified injury at T11-T12 level of thoracic spinal cord, sequela: Secondary | ICD-10-CM | POA: Diagnosis not present

## 2017-05-30 DIAGNOSIS — L988 Other specified disorders of the skin and subcutaneous tissue: Secondary | ICD-10-CM | POA: Insufficient documentation

## 2017-05-30 DIAGNOSIS — G8918 Other acute postprocedural pain: Secondary | ICD-10-CM

## 2017-05-30 DIAGNOSIS — T148XXA Other injury of unspecified body region, initial encounter: Secondary | ICD-10-CM | POA: Diagnosis present

## 2017-05-30 MED ORDER — GABAPENTIN 300 MG PO CAPS
600.0000 mg | ORAL_CAPSULE | Freq: Three times a day (TID) | ORAL | 3 refills | Status: AC
Start: 2017-05-30 — End: ?

## 2017-05-30 MED FILL — GABAPENTIN 300 MG CAPSULE: 300 | 30 days supply | Qty: 180 | Fill #0
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
## 2017-05-30 NOTE — Patient Instructions (Signed)
GABAPENTIN: NERVE PAIN  300-300-600 FOR 4 DAYS, THEN 600-300-600 FOR  4 DAYS, THEN  THREE X DAILY   TALK WITH NURSE ABOUT EDUCATION FOR CATHETER MANAGEMENT, SELF-CATHING

## 2017-06-01 ENCOUNTER — Telehealth: Payer: Self-pay
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
## 2017-06-01 NOTE — Telephone Encounter (Signed)
As long as there isn't fever, sob, or other respiratory distress, I would observe only for now

## 2017-06-04 MED FILL — METHOCARBAMOL 500 MG TABLET: 500 | 7 days supply | Qty: 30 | Fill #1

## 2017-06-04 MED FILL — traMADol HCL 50 MG TABS: 50 | 15 days supply | Qty: 60 | Fill #0

## 2017-06-06 MED FILL — VENLAFAXINE HCL 75 MG TAB: 75 | 30 days supply | Qty: 60 | Fill #1

## 2017-06-08 ENCOUNTER — Encounter: Payer: Medicaid Other | Attending: Physical Medicine & Rehabilitation | Admitting: Psychology

## 2017-06-08 DIAGNOSIS — F17210 Nicotine dependence, cigarettes, uncomplicated: Secondary | ICD-10-CM | POA: Diagnosis not present

## 2017-06-08 DIAGNOSIS — S24104S Unspecified injury at T11-T12 level of thoracic spinal cord, sequela: Secondary | ICD-10-CM | POA: Insufficient documentation

## 2017-06-08 DIAGNOSIS — F329 Major depressive disorder, single episode, unspecified: Secondary | ICD-10-CM | POA: Diagnosis not present

## 2017-06-08 DIAGNOSIS — L988 Other specified disorders of the skin and subcutaneous tissue: Secondary | ICD-10-CM | POA: Insufficient documentation

## 2017-06-08 DIAGNOSIS — G8918 Other acute postprocedural pain: Secondary | ICD-10-CM | POA: Diagnosis not present

## 2017-06-08 DIAGNOSIS — F4310 Post-traumatic stress disorder, unspecified: Secondary | ICD-10-CM | POA: Diagnosis not present

## 2017-06-08 DIAGNOSIS — M79604 Pain in right leg: Secondary | ICD-10-CM | POA: Insufficient documentation

## 2017-06-08 DIAGNOSIS — G8220 Paraplegia, unspecified: Secondary | ICD-10-CM

## 2017-06-08 DIAGNOSIS — T148XXA Other injury of unspecified body region, initial encounter: Secondary | ICD-10-CM | POA: Diagnosis present

## 2017-06-08 DIAGNOSIS — M79605 Pain in left leg: Secondary | ICD-10-CM | POA: Insufficient documentation

## 2017-06-19 MED FILL — METHOCARBAMOL 500 MG TABS: 500 | 7 days supply | Qty: 30 | Fill #2

## 2017-06-19 MED FILL — MIRTAZAPINE 7.5 MG TABLET: 7.5 | 30 days supply | Qty: 30 | Fill #1

## 2017-06-19 MED FILL — NORTRIPTYLINE HCL 75 MG CAP: 75 | 30 days supply | Qty: 30 | Fill #1

## 2017-06-27 MED FILL — NADOLOL 20 MG TAB: 20 | 30 days supply | Qty: 30 | Fill #1

## 2017-07-02 MED FILL — METHOCARBAMOL 500 MG TABS: 500 | 7 days supply | Qty: 30 | Fill #3

## 2017-07-02 MED FILL — VENLAFAXINE HCL 75 MG TAB: 75 | 30 days supply | Qty: 60 | Fill #2

## 2017-07-03 ENCOUNTER — Ambulatory Visit: Payer: Medicaid Other | Attending: Physical Medicine & Rehabilitation | Admitting: Physical Therapy

## 2017-07-03 ENCOUNTER — Ambulatory Visit: Payer: Medicaid Other | Admitting: Occupational Therapy

## 2017-07-03 ENCOUNTER — Encounter: Payer: Self-pay | Admitting: Physical Therapy

## 2017-07-03 DIAGNOSIS — R293 Abnormal posture: Secondary | ICD-10-CM | POA: Diagnosis present

## 2017-07-03 DIAGNOSIS — R2681 Unsteadiness on feet: Secondary | ICD-10-CM | POA: Diagnosis present

## 2017-07-03 DIAGNOSIS — G8222 Paraplegia, incomplete: Secondary | ICD-10-CM | POA: Diagnosis present

## 2017-07-03 DIAGNOSIS — G8221 Paraplegia, complete: Secondary | ICD-10-CM

## 2017-07-03 DIAGNOSIS — M6281 Muscle weakness (generalized): Secondary | ICD-10-CM

## 2017-07-03 DIAGNOSIS — Z7409 Other reduced mobility: Secondary | ICD-10-CM | POA: Insufficient documentation
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
## 2017-07-04 ENCOUNTER — Encounter: Payer: Self-pay | Admitting: Psychology
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
## 2017-07-12 ENCOUNTER — Other Ambulatory Visit: Payer: Self-pay | Admitting: Family Medicine

## 2017-07-13 MED FILL — METHOCARBAMOL 500 MG TABS: 500 | 7 days supply | Qty: 30 | Fill #0

## 2017-07-17 ENCOUNTER — Ambulatory Visit: Payer: Medicaid Other | Attending: Physical Medicine & Rehabilitation | Admitting: Occupational Therapy

## 2017-07-17 ENCOUNTER — Encounter: Payer: Self-pay | Admitting: Occupational Therapy

## 2017-07-17 DIAGNOSIS — M6281 Muscle weakness (generalized): Secondary | ICD-10-CM

## 2017-07-17 DIAGNOSIS — R293 Abnormal posture: Secondary | ICD-10-CM | POA: Insufficient documentation

## 2017-07-17 DIAGNOSIS — G8221 Paraplegia, complete: Secondary | ICD-10-CM | POA: Insufficient documentation
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
## 2017-07-20 MED FILL — MIRTAZAPINE 7.5 MG TABLET: 7.5 | 30 days supply | Qty: 30 | Fill #2

## 2017-07-20 MED FILL — GABAPENTIN 300 MG CAPSULE: 300 | 30 days supply | Qty: 180 | Fill #1

## 2017-07-20 MED FILL — NORTRIPTYLINE HCL 75 MG CAP: 75 | 30 days supply | Qty: 30 | Fill #2

## 2017-07-20 MED FILL — NADOLOL 20 MG TAB: 20 | 30 days supply | Qty: 30 | Fill #0

## 2017-07-20 MED FILL — METHOCARBAMOL 500 MG TABS: 500 | 7 days supply | Qty: 30 | Fill #1

## 2017-07-28 ENCOUNTER — Inpatient Hospital Stay (HOSPITAL_COMMUNITY)
Admission: EM | Admit: 2017-07-28 | Discharge: 2017-08-02 | DRG: 698 | Disposition: A | Discharge status: Home Health | Payer: Medicaid Other | Attending: Internal Medicine | Admitting: Internal Medicine

## 2017-07-28 ENCOUNTER — Other Ambulatory Visit: Payer: Self-pay

## 2017-07-28 ENCOUNTER — Emergency Department (HOSPITAL_COMMUNITY): Payer: Medicaid Other

## 2017-07-28 ENCOUNTER — Encounter (HOSPITAL_COMMUNITY): Payer: Self-pay

## 2017-07-28 DIAGNOSIS — G8220 Paraplegia, unspecified: Secondary | ICD-10-CM | POA: Diagnosis present

## 2017-07-28 DIAGNOSIS — R000 Tachycardia, unspecified: Secondary | ICD-10-CM | POA: Diagnosis present

## 2017-07-28 DIAGNOSIS — S24104S Unspecified injury at T11-T12 level of thoracic spinal cord, sequela: Secondary | ICD-10-CM

## 2017-07-28 DIAGNOSIS — R600 Localized edema: Secondary | ICD-10-CM | POA: Diagnosis present

## 2017-07-28 DIAGNOSIS — R509 Fever, unspecified: Secondary | ICD-10-CM | POA: Diagnosis not present

## 2017-07-28 DIAGNOSIS — R0981 Nasal congestion: Secondary | ICD-10-CM | POA: Diagnosis present

## 2017-07-28 DIAGNOSIS — Z833 Family history of diabetes mellitus: Secondary | ICD-10-CM

## 2017-07-28 DIAGNOSIS — D72829 Elevated white blood cell count, unspecified: Secondary | ICD-10-CM | POA: Diagnosis present

## 2017-07-28 DIAGNOSIS — Z8249 Family history of ischemic heart disease and other diseases of the circulatory system: Secondary | ICD-10-CM

## 2017-07-28 DIAGNOSIS — A419 Sepsis, unspecified organism: Secondary | ICD-10-CM | POA: Diagnosis present

## 2017-07-28 DIAGNOSIS — M792 Neuralgia and neuritis, unspecified: Secondary | ICD-10-CM | POA: Diagnosis present

## 2017-07-28 DIAGNOSIS — Z79891 Long term (current) use of opiate analgesic: Secondary | ICD-10-CM

## 2017-07-28 DIAGNOSIS — Z9889 Other specified postprocedural states: Secondary | ICD-10-CM

## 2017-07-28 DIAGNOSIS — S24104A Unspecified injury at T11-T12 level of thoracic spinal cord, initial encounter: Secondary | ICD-10-CM | POA: Diagnosis present

## 2017-07-28 DIAGNOSIS — L89622 Pressure ulcer of left heel, stage 2: Secondary | ICD-10-CM | POA: Diagnosis present

## 2017-07-28 DIAGNOSIS — L8990 Pressure ulcer of unspecified site, unspecified stage: Secondary | ICD-10-CM

## 2017-07-28 DIAGNOSIS — Z87440 Personal history of urinary (tract) infections: Secondary | ICD-10-CM

## 2017-07-28 DIAGNOSIS — Z79899 Other long term (current) drug therapy: Secondary | ICD-10-CM

## 2017-07-28 DIAGNOSIS — F4310 Post-traumatic stress disorder, unspecified: Secondary | ICD-10-CM

## 2017-07-28 DIAGNOSIS — T83511A Infection and inflammatory reaction due to indwelling urethral catheter, initial encounter: Principal | ICD-10-CM | POA: Diagnosis present

## 2017-07-28 DIAGNOSIS — Z23 Encounter for immunization: Secondary | ICD-10-CM

## 2017-07-28 DIAGNOSIS — L89890 Pressure ulcer of other site, unstageable: Secondary | ICD-10-CM | POA: Diagnosis present

## 2017-07-28 DIAGNOSIS — F329 Major depressive disorder, single episode, unspecified: Secondary | ICD-10-CM | POA: Diagnosis present

## 2017-07-28 DIAGNOSIS — N319 Neuromuscular dysfunction of bladder, unspecified: Secondary | ICD-10-CM | POA: Diagnosis present

## 2017-07-28 DIAGNOSIS — F419 Anxiety disorder, unspecified: Secondary | ICD-10-CM | POA: Diagnosis present

## 2017-07-28 DIAGNOSIS — N390 Urinary tract infection, site not specified: Secondary | ICD-10-CM | POA: Diagnosis not present

## 2017-07-28 DIAGNOSIS — W3400XA Accidental discharge from unspecified firearms or gun, initial encounter: Secondary | ICD-10-CM

## 2017-07-28 DIAGNOSIS — F17210 Nicotine dependence, cigarettes, uncomplicated: Secondary | ICD-10-CM | POA: Diagnosis present

## 2017-07-28 DIAGNOSIS — Z9081 Acquired absence of spleen: Secondary | ICD-10-CM

## 2017-07-28 DIAGNOSIS — Y846 Urinary catheterization as the cause of abnormal reaction of the patient, or of later complication, without mention of misadventure at the time of the procedure: Secondary | ICD-10-CM | POA: Diagnosis present

## 2017-07-28 DIAGNOSIS — Z809 Family history of malignant neoplasm, unspecified: Secondary | ICD-10-CM

## 2017-07-28 DIAGNOSIS — I10 Essential (primary) hypertension: Secondary | ICD-10-CM | POA: Diagnosis present

## 2017-07-28 LAB — CBC WITH DIFFERENTIAL/PLATELET
Basophils Absolute: 0 10*3/uL (ref 0.0–0.1)
Basophils Relative: 0 %
EOS ABS: 0 10*3/uL (ref 0.0–0.7)
EOS PCT: 0 %
HCT: 32.1 % — ABNORMAL LOW (ref 39.0–52.0)
Hemoglobin: 10.5 g/dL — ABNORMAL LOW (ref 13.0–17.0)
LYMPHS ABS: 2.3 10*3/uL (ref 0.7–4.0)
Lymphocytes Relative: 13 %
MCH: 27.9 pg (ref 26.0–34.0)
MCHC: 32.7 g/dL (ref 30.0–36.0)
MCV: 85.1 fL (ref 78.0–100.0)
MONO ABS: 2 10*3/uL — AB (ref 0.1–1.0)
Monocytes Relative: 11 %
Neutro Abs: 13.4 10*3/uL — ABNORMAL HIGH (ref 1.7–7.7)
Neutrophils Relative %: 76 %
PLATELETS: 553 10*3/uL — AB (ref 150–400)
RBC: 3.77 MIL/uL — AB (ref 4.22–5.81)
RDW: 14.6 % (ref 11.5–15.5)
WBC: 17.8 10*3/uL — AB (ref 4.0–10.5)

## 2017-07-28 LAB — COMPREHENSIVE METABOLIC PANEL
ALBUMIN: 2.5 g/dL — AB (ref 3.5–5.0)
ALK PHOS: 166 U/L — AB (ref 38–126)
ALT: 37 U/L (ref 17–63)
AST: 22 U/L (ref 15–41)
Anion gap: 10 (ref 5–15)
BUN: 11 mg/dL (ref 6–20)
CALCIUM: 8.7 mg/dL — AB (ref 8.9–10.3)
CHLORIDE: 94 mmol/L — AB (ref 101–111)
CO2: 28 mmol/L (ref 22–32)
CREATININE: 0.93 mg/dL (ref 0.61–1.24)
GFR calc non Af Amer: >60 mL/min (ref >60)
GLUCOSE: 104 mg/dL — AB (ref 65–99)
Potassium: 4.2 mmol/L (ref 3.5–5.1)
SODIUM: 132 mmol/L — AB (ref 135–145)
Total Bilirubin: 0.5 mg/dL (ref 0.3–1.2)
Total Protein: 7.6 g/dL (ref 6.5–8.1)

## 2017-07-28 LAB — RAPID URINE DRUG SCREEN, HOSP PERFORMED
Amphetamines: NOT DETECTED
BARBITURATES: NOT DETECTED
Benzodiazepines: NOT DETECTED
Cocaine: NOT DETECTED
Opiates: NOT DETECTED
Tetrahydrocannabinol: NOT DETECTED

## 2017-07-28 LAB — URINALYSIS, MICROSCOPIC (REFLEX): Squamous Epithelial / LPF: NONE SEEN

## 2017-07-28 LAB — URINALYSIS, ROUTINE W REFLEX MICROSCOPIC
BILIRUBIN URINE: NEGATIVE
GLUCOSE, UA: NEGATIVE mg/dL
KETONES UR: NEGATIVE mg/dL
NITRITE: POSITIVE — AB
PH: 8 (ref 5.0–8.0)
Protein, ur: 100 mg/dL — AB
SPECIFIC GRAVITY, URINE: 1.02 (ref 1.005–1.030)

## 2017-07-28 LAB — PROTIME-INR
INR: 1.29
PROTHROMBIN TIME: 16 s — AB (ref 11.4–15.2)

## 2017-07-28 LAB — I-STAT CG4 LACTIC ACID, ED: Lactic Acid, Venous: 1.14 mmol/L (ref 0.5–1.9)

## 2017-07-28 MED ORDER — SODIUM CHLORIDE 0.9 % IV SOLN
INTRAVENOUS | Status: DC
Start: 2017-07-28 — End: 2017-08-02
  Administered 2017-07-28 – 2017-08-01 (×7): via INTRAVENOUS

## 2017-07-28 MED ORDER — VANCOMYCIN HCL IN DEXTROSE 1-5 GM/200ML-% IV SOLN
1000.0000 mg | Freq: Once | INTRAVENOUS | Status: AC
  Administered 2017-07-28: 1000 mg via INTRAVENOUS
  Filled 2017-07-28: qty 200

## 2017-07-28 MED ORDER — TRAMADOL HCL 50 MG PO TABS
50.0000 mg | ORAL_TABLET | Freq: Four times a day (QID) | ORAL | Status: DC | PRN
  Administered 2017-07-29 – 2017-08-01 (×3): 50 mg via ORAL
  Filled 2017-07-28 (×3): qty 1

## 2017-07-28 MED ORDER — ACETAMINOPHEN 650 MG RE SUPP: 650.0000 mg | Freq: Four times a day (QID) | RECTAL | Status: DC | PRN

## 2017-07-28 MED ORDER — GABAPENTIN 300 MG PO CAPS
600.0000 mg | ORAL_CAPSULE | Freq: Once | ORAL | Status: AC
  Administered 2017-07-28: 600 mg via ORAL
  Filled 2017-07-28: qty 2

## 2017-07-28 MED ORDER — NORTRIPTYLINE HCL 25 MG PO CAPS
75.0000 mg | ORAL_CAPSULE | Freq: Every day | ORAL | Status: DC
  Administered 2017-07-28 – 2017-08-01 (×5): 75 mg via ORAL
  Filled 2017-07-28 (×6): qty 3

## 2017-07-28 MED ORDER — GABAPENTIN 300 MG PO CAPS
600.0000 mg | ORAL_CAPSULE | Freq: Three times a day (TID) | ORAL | Status: DC
  Administered 2017-07-28 – 2017-08-02 (×14): 600 mg via ORAL
  Filled 2017-07-28 (×14): qty 2

## 2017-07-28 MED ORDER — PIPERACILLIN-TAZOBACTAM 3.375 G IVPB 30 MIN
3.3750 g | Freq: Once | INTRAVENOUS | Status: AC
  Administered 2017-07-28: 3.375 g via INTRAVENOUS
  Filled 2017-07-28: qty 50

## 2017-07-28 MED ORDER — MIRTAZAPINE 15 MG PO TABS
7.5000 mg | ORAL_TABLET | Freq: Every day | ORAL | Status: DC
  Administered 2017-07-28 – 2017-08-01 (×5): 7.5 mg via ORAL
  Filled 2017-07-28 (×5): qty 1

## 2017-07-28 MED ORDER — VENLAFAXINE HCL 75 MG PO TABS
75.0000 mg | ORAL_TABLET | Freq: Two times a day (BID) | ORAL | Status: DC
  Administered 2017-07-28 – 2017-08-02 (×10): 75 mg via ORAL
  Filled 2017-07-28 (×11): qty 1

## 2017-07-28 MED ORDER — INFLUENZA VAC SPLIT QUAD 0.5 ML IM SUSY
0.5000 mL | PREFILLED_SYRINGE | INTRAMUSCULAR | Status: AC
  Administered 2017-07-31: 0.5 mL via INTRAMUSCULAR
  Filled 2017-07-28: qty 0.5

## 2017-07-28 MED ORDER — SENNA 8.6 MG PO TABS
2.0000 | ORAL_TABLET | Freq: Two times a day (BID) | ORAL | Status: DC
  Administered 2017-07-28 – 2017-08-02 (×9): 17.2 mg via ORAL
  Filled 2017-07-28 (×9): qty 2

## 2017-07-28 MED ORDER — DEXTROSE 5 % IV SOLN
1.0000 g | INTRAVENOUS | Status: DC
  Administered 2017-07-28: 1 g via INTRAVENOUS
  Filled 2017-07-28 (×2): qty 10

## 2017-07-28 MED ORDER — BISACODYL 10 MG RE SUPP: 10.0000 mg | Freq: Every day | RECTAL | Status: DC | PRN

## 2017-07-28 MED ORDER — METHOCARBAMOL 500 MG PO TABS
500.0000 mg | ORAL_TABLET | Freq: Four times a day (QID) | ORAL | Status: DC | PRN
  Administered 2017-07-29 – 2017-08-01 (×5): 500 mg via ORAL
  Filled 2017-07-28 (×5): qty 1

## 2017-07-28 MED ORDER — ENOXAPARIN SODIUM 40 MG/0.4ML ~~LOC~~ SOLN
40.0000 mg | SUBCUTANEOUS | Status: DC
  Administered 2017-07-28: 40 mg via SUBCUTANEOUS
  Filled 2017-07-28 (×3): qty 0.4

## 2017-07-28 MED ORDER — ONDANSETRON HCL 4 MG/2ML IJ SOLN: 4.0000 mg | Freq: Four times a day (QID) | INTRAMUSCULAR | Status: DC | PRN

## 2017-07-28 MED ORDER — TAMSULOSIN HCL 0.4 MG PO CAPS
0.4000 mg | ORAL_CAPSULE | Freq: Every day | ORAL | Status: DC
  Administered 2017-07-28 – 2017-08-02 (×6): 0.4 mg via ORAL
  Filled 2017-07-28 (×6): qty 1

## 2017-07-28 MED ORDER — ONDANSETRON HCL 4 MG PO TABS: 4.0000 mg | ORAL_TABLET | Freq: Four times a day (QID) | ORAL | Status: DC | PRN

## 2017-07-28 MED ORDER — ACETAMINOPHEN 325 MG PO TABS
650.0000 mg | ORAL_TABLET | Freq: Four times a day (QID) | ORAL | Status: DC | PRN
  Administered 2017-07-28 – 2017-07-29 (×2): 650 mg via ORAL
  Filled 2017-07-28 (×2): qty 2

## 2017-07-28 MED ORDER — METHOCARBAMOL 500 MG PO TABS
500.0000 mg | ORAL_TABLET | Freq: Once | ORAL | Status: AC
  Administered 2017-07-28: 500 mg via ORAL
  Filled 2017-07-28: qty 1

## 2017-07-28 MED ORDER — SODIUM CHLORIDE 0.9 % IV BOLUS (SEPSIS)
1000.0000 mL | Freq: Once | INTRAVENOUS | Status: AC
  Administered 2017-07-28: 1000 mL via INTRAVENOUS

## 2017-07-28 MED ORDER — NADOLOL 20 MG PO TABS
20.0000 mg | ORAL_TABLET | Freq: Every day | ORAL | Status: DC
  Administered 2017-07-28 – 2017-08-01 (×5): 20 mg via ORAL
  Filled 2017-07-28 (×6): qty 1

## 2017-07-28 NOTE — Progress Notes (Signed)
A consult was received from an ED physician for Vancomycin and Zosyn per pharmacy dosing.  The patient's profile has been reviewed for ht/wt/allergies/indication/available labs.   A one time order has already been placed for Vancomycin 1gm and Zosyn 3.375gm.  Further antibiotics/pharmacy consults should be ordered by admitting physician if indicated.                       Thank you, Maryellen PilePoindexter, Jamilet Ambroise Trefz, PharmD 07/28/2017  3:18 PM
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
## 2017-07-28 NOTE — ED Notes (Signed)
ED Provider at bedside. 

## 2017-07-28 NOTE — Progress Notes (Signed)
Late entry: 1745  Patient arrived on unit via stretcher from ED.  Mother at bedside.  Telemetry placed per MD order and CMT notified.

## 2017-07-28 NOTE — ED Triage Notes (Signed)
Transported by [facility] from home. Patient reports fever and nausea x 3 days. PTA [facility] administered 1 g of Tylenol (temporal temp reading of 103.6). Per EMS patient is a paraplegic and has a foley catheter in place (patient also reports cloudy urine.) Denies any abdominal, chest pain or SHOB.
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
## 2017-07-29 DIAGNOSIS — F17210 Nicotine dependence, cigarettes, uncomplicated: Secondary | ICD-10-CM | POA: Diagnosis present

## 2017-07-29 DIAGNOSIS — Y846 Urinary catheterization as the cause of abnormal reaction of the patient, or of later complication, without mention of misadventure at the time of the procedure: Secondary | ICD-10-CM | POA: Diagnosis present

## 2017-07-29 DIAGNOSIS — N319 Neuromuscular dysfunction of bladder, unspecified: Secondary | ICD-10-CM | POA: Diagnosis present

## 2017-07-29 DIAGNOSIS — N390 Urinary tract infection, site not specified: Secondary | ICD-10-CM | POA: Diagnosis not present

## 2017-07-29 DIAGNOSIS — I10 Essential (primary) hypertension: Secondary | ICD-10-CM | POA: Diagnosis present

## 2017-07-29 DIAGNOSIS — F419 Anxiety disorder, unspecified: Secondary | ICD-10-CM | POA: Diagnosis present

## 2017-07-29 DIAGNOSIS — Z833 Family history of diabetes mellitus: Secondary | ICD-10-CM | POA: Diagnosis not present

## 2017-07-29 DIAGNOSIS — R600 Localized edema: Secondary | ICD-10-CM | POA: Diagnosis present

## 2017-07-29 DIAGNOSIS — Z23 Encounter for immunization: Secondary | ICD-10-CM | POA: Diagnosis not present

## 2017-07-29 DIAGNOSIS — W3400XA Accidental discharge from unspecified firearms or gun, initial encounter: Secondary | ICD-10-CM | POA: Diagnosis not present

## 2017-07-29 DIAGNOSIS — L89890 Pressure ulcer of other site, unstageable: Secondary | ICD-10-CM | POA: Diagnosis present

## 2017-07-29 DIAGNOSIS — D72829 Elevated white blood cell count, unspecified: Secondary | ICD-10-CM | POA: Diagnosis not present

## 2017-07-29 DIAGNOSIS — Z79891 Long term (current) use of opiate analgesic: Secondary | ICD-10-CM | POA: Diagnosis not present

## 2017-07-29 DIAGNOSIS — Z9081 Acquired absence of spleen: Secondary | ICD-10-CM | POA: Diagnosis not present

## 2017-07-29 DIAGNOSIS — F329 Major depressive disorder, single episode, unspecified: Secondary | ICD-10-CM | POA: Diagnosis present

## 2017-07-29 DIAGNOSIS — Z8249 Family history of ischemic heart disease and other diseases of the circulatory system: Secondary | ICD-10-CM | POA: Diagnosis not present

## 2017-07-29 DIAGNOSIS — L89622 Pressure ulcer of left heel, stage 2: Secondary | ICD-10-CM | POA: Diagnosis present

## 2017-07-29 DIAGNOSIS — T83511A Infection and inflammatory reaction due to indwelling urethral catheter, initial encounter: Secondary | ICD-10-CM | POA: Diagnosis not present

## 2017-07-29 DIAGNOSIS — L8990 Pressure ulcer of unspecified site, unspecified stage: Secondary | ICD-10-CM

## 2017-07-29 DIAGNOSIS — M7989 Other specified soft tissue disorders: Secondary | ICD-10-CM | POA: Diagnosis not present

## 2017-07-29 DIAGNOSIS — G8220 Paraplegia, unspecified: Secondary | ICD-10-CM | POA: Diagnosis present

## 2017-07-29 DIAGNOSIS — Z79899 Other long term (current) drug therapy: Secondary | ICD-10-CM | POA: Diagnosis not present

## 2017-07-29 DIAGNOSIS — Z87440 Personal history of urinary (tract) infections: Secondary | ICD-10-CM | POA: Diagnosis not present

## 2017-07-29 DIAGNOSIS — Z809 Family history of malignant neoplasm, unspecified: Secondary | ICD-10-CM | POA: Diagnosis not present

## 2017-07-29 DIAGNOSIS — A419 Sepsis, unspecified organism: Secondary | ICD-10-CM | POA: Diagnosis present

## 2017-07-29 DIAGNOSIS — R0981 Nasal congestion: Secondary | ICD-10-CM | POA: Diagnosis present

## 2017-07-29 LAB — CBC
HCT: 31.9 % — ABNORMAL LOW (ref 39.0–52.0)
HEMOGLOBIN: 10.3 g/dL — AB (ref 13.0–17.0)
MCH: 27.7 pg (ref 26.0–34.0)
MCHC: 32.3 g/dL (ref 30.0–36.0)
MCV: 85.8 fL (ref 78.0–100.0)
Platelets: 517 10*3/uL — ABNORMAL HIGH (ref 150–400)
RBC: 3.72 MIL/uL — AB (ref 4.22–5.81)
RDW: 14.6 % (ref 11.5–15.5)
WBC: 17.7 10*3/uL — AB (ref 4.0–10.5)

## 2017-07-29 LAB — BASIC METABOLIC PANEL
ANION GAP: 9 (ref 5–15)
BUN: 8 mg/dL (ref 6–20)
CALCIUM: 8.7 mg/dL — AB (ref 8.9–10.3)
CO2: 26 mmol/L (ref 22–32)
Chloride: 102 mmol/L (ref 101–111)
Creatinine, Ser: 0.87 mg/dL (ref 0.61–1.24)
Glucose, Bld: 112 mg/dL — ABNORMAL HIGH (ref 65–99)
Potassium: 4.4 mmol/L (ref 3.5–5.1)
SODIUM: 137 mmol/L (ref 135–145)

## 2017-07-29 MED ORDER — DEXTROSE 5 % IV SOLN
2.0000 g | INTRAVENOUS | Status: DC
  Administered 2017-07-29 – 2017-08-01 (×4): 2 g via INTRAVENOUS
  Filled 2017-07-29 (×5): qty 2

## 2017-07-29 MED ORDER — LACTULOSE 10 GM/15ML PO SOLN
30.0000 g | Freq: Once | ORAL | Status: AC
  Administered 2017-07-29: 30 g via ORAL
  Filled 2017-07-29: qty 45

## 2017-07-29 MED ORDER — SODIUM CHLORIDE 0.9 % IV BOLUS (SEPSIS)
1000.0000 mL | Freq: Once | INTRAVENOUS | Status: AC
Start: 2017-07-29 — End: 2017-07-29
  Administered 2017-07-29: 1000 mL via INTRAVENOUS
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
## 2017-07-30 ENCOUNTER — Inpatient Hospital Stay (HOSPITAL_COMMUNITY): Payer: Medicaid Other

## 2017-07-30 DIAGNOSIS — M7989 Other specified soft tissue disorders: Secondary | ICD-10-CM

## 2017-07-30 DIAGNOSIS — W3400XA Accidental discharge from unspecified firearms or gun, initial encounter: Secondary | ICD-10-CM

## 2017-07-30 DIAGNOSIS — N390 Urinary tract infection, site not specified: Secondary | ICD-10-CM

## 2017-07-30 LAB — BASIC METABOLIC PANEL
ANION GAP: 5 (ref 5–15)
BUN: 6 mg/dL (ref 6–20)
CALCIUM: 8.3 mg/dL — AB (ref 8.9–10.3)
CO2: 27 mmol/L (ref 22–32)
Chloride: 104 mmol/L (ref 101–111)
Creatinine, Ser: 0.61 mg/dL (ref 0.61–1.24)
GLUCOSE: 108 mg/dL — AB (ref 65–99)
Potassium: 4.2 mmol/L (ref 3.5–5.1)
Sodium: 136 mmol/L (ref 135–145)

## 2017-07-30 LAB — URINE CULTURE

## 2017-07-30 LAB — CBC
HCT: 27.8 % — ABNORMAL LOW (ref 39.0–52.0)
Hemoglobin: 8.8 g/dL — ABNORMAL LOW (ref 13.0–17.0)
MCH: 27.3 pg (ref 26.0–34.0)
MCHC: 31.7 g/dL (ref 30.0–36.0)
MCV: 86.3 fL (ref 78.0–100.0)
PLATELETS: 546 10*3/uL — AB (ref 150–400)
RBC: 3.22 MIL/uL — ABNORMAL LOW (ref 4.22–5.81)
RDW: 14.8 % (ref 11.5–15.5)
WBC: 14.6 10*3/uL — AB (ref 4.0–10.5)

## 2017-07-30 MED ORDER — COLLAGENASE 250 UNIT/GM EX OINT
TOPICAL_OINTMENT | Freq: Every day | CUTANEOUS | Status: DC
  Administered 2017-07-30 – 2017-08-02 (×4): via TOPICAL
  Filled 2017-07-30: qty 30
  Filled 2017-07-30: qty 90
  Filled 2017-07-30: qty 30
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
## 2017-07-30 NOTE — Consult Note (Addendum)
WOC Nurse wound consult note Reason for Consult: right posterior calf Unstageable pressure injury and left medial heel Stage 2 pressure injury Wound type:Pressure Pressure Injury POA: Yes Measurement: Right posterior calf with eschar (dry) obscuring wound bed measuring 4cm x 2cm. No exudate Left medial heel with ruptured blister revealing pink moist wound bed (Stage 2) 2cm round x 0.1cm. Scant serous exudate Wound bed:As described above Drainage (amount, consistency, odor) As described above Periwound:intact, dry Dressing procedure/placement/frequency: I will provide guidance for Nursing via the orders for once daily wound care to the two areas, collagenase is ordered to enzymatically debride the eschar over the right posterior calf; Vaseline gauze is ordered to rehydrate and enhance reepithelialization to the left medial heel.  Bilateral pressure redistribution heel boots are provided to assist in tissue repair and regeneration in both areas. Patient is asked about the need for a pressure redistribution chair pad and he declines the provision of the product stating that he is either in bed or a wheelchair and that he uses a chair pad for his wheelchair Vonna Kotyk(Jay, he thinks). It is only while here in house that he is OOB to chair without one. WOC nursing team will not follow, but will remain available to this patient, the nursing and medical teams.  Please re-consult if needed. Thanks, Ladona MowLaurie Shakeena Kafer, MSN, RN, GNP, Hans EdenCWOCN, CWON-AP, FAAN  Pager# [phone]  :

## 2017-07-30 NOTE — Progress Notes (Signed)
Bilateral lower extremity venous duplex has been completed. Negative for DVT.  07/30/17 2:28 PM Olen CordialGreg Bently Wyss RVT

## 2017-07-31 ENCOUNTER — Ambulatory Visit: Payer: Medicaid Other | Admitting: Occupational Therapy

## 2017-07-31 ENCOUNTER — Encounter: Payer: Medicaid Other | Admitting: Physical Medicine & Rehabilitation

## 2017-07-31 LAB — CBC
HEMATOCRIT: 30.5 % — AB (ref 39.0–52.0)
Hemoglobin: 9.6 g/dL — ABNORMAL LOW (ref 13.0–17.0)
MCH: 27.1 pg (ref 26.0–34.0)
MCHC: 31.5 g/dL (ref 30.0–36.0)
MCV: 86.2 fL (ref 78.0–100.0)
PLATELETS: 544 10*3/uL — AB (ref 150–400)
RBC: 3.54 MIL/uL — AB (ref 4.22–5.81)
RDW: 14.7 % (ref 11.5–15.5)
WBC: 10.6 10*3/uL — AB (ref 4.0–10.5)

## 2017-07-31 LAB — BASIC METABOLIC PANEL
ANION GAP: 8 (ref 5–15)
BUN: 6 mg/dL (ref 6–20)
CALCIUM: 8.7 mg/dL — AB (ref 8.9–10.3)
CO2: 25 mmol/L (ref 22–32)
Chloride: 103 mmol/L (ref 101–111)
Creatinine, Ser: 0.53 mg/dL — ABNORMAL LOW (ref 0.61–1.24)
GLUCOSE: 92 mg/dL (ref 65–99)
POTASSIUM: 4.3 mmol/L (ref 3.5–5.1)
Sodium: 136 mmol/L (ref 135–145)

## 2017-07-31 NOTE — Care Management Note (Signed)
Case Management Note  Patient Details  Name: Craig Calderon MRN: [id number] Date of Birth: 05/01/1991  Subjective/Objective:                  sepsis  Action/Plan: Date: July 31, 2017 Marcelle SmilingRhonda Quentin Shorey, BSN, [place], [place]  [phone] Chart and notes review for patient progress and needs. Will follow for case management and discharge needs. Next review date: [id number]  Expected Discharge Date:  07/30/17               Expected Discharge Plan:  Home/Self Care  In-House Referral:     Discharge planning Services  CM Consult  Post Acute Care Choice:    Choice offered to:     DME Arranged:    DME Agency:     HH Arranged:    HH Agency:     Status of Service:  In process, will continue to follow  If discussed at Long Length of Stay Meetings, dates discussed:    Additional Comments:  Golda AcreDavis, Jaimie Redditt Lynn, RN 07/31/2017, 8:44 AM
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
## 2017-08-01 LAB — URINE CULTURE
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
## 2017-08-02 LAB — CBC
HEMATOCRIT: 31.3 % — AB (ref 39.0–52.0)
HEMOGLOBIN: 10 g/dL — AB (ref 13.0–17.0)
MCH: 27.6 pg (ref 26.0–34.0)
MCHC: 31.9 g/dL (ref 30.0–36.0)
MCV: 86.5 fL (ref 78.0–100.0)
Platelets: 652 10*3/uL — ABNORMAL HIGH (ref 150–400)
RBC: 3.62 MIL/uL — AB (ref 4.22–5.81)
RDW: 14.6 % (ref 11.5–15.5)
WBC: 10.2 10*3/uL (ref 4.0–10.5)

## 2017-08-02 LAB — CULTURE, BLOOD (ROUTINE X 2)
Culture: NO GROWTH
Culture: NO GROWTH
SPECIAL REQUESTS: ADEQUATE
Special Requests: ADEQUATE

## 2017-08-02 LAB — BASIC METABOLIC PANEL
ANION GAP: 8 (ref 5–15)
BUN: 8 mg/dL (ref 6–20)
CALCIUM: 8.8 mg/dL — AB (ref 8.9–10.3)
CO2: 26 mmol/L (ref 22–32)
Chloride: 103 mmol/L (ref 101–111)
Creatinine, Ser: 0.52 mg/dL — ABNORMAL LOW (ref 0.61–1.24)
GLUCOSE: 94 mg/dL (ref 65–99)
POTASSIUM: 4.2 mmol/L (ref 3.5–5.1)
SODIUM: 137 mmol/L (ref 135–145)

## 2017-08-02 MED ORDER — PREMIER PROTEIN SHAKE
11.0000 [oz_av] | Freq: Two times a day (BID) | ORAL | Status: DC
  Filled 2017-08-02: qty 325.31

## 2017-08-02 MED ORDER — ACETAMINOPHEN 325 MG PO TABS: 650.0000 mg | ORAL_TABLET | Freq: Four times a day (QID) | ORAL | 0 refills | Status: AC | PRN

## 2017-08-02 MED ORDER — CIPROFLOXACIN HCL 500 MG PO TABS: 500.0000 mg | ORAL_TABLET | Freq: Two times a day (BID) | ORAL | 0 refills | Status: AC

## 2017-08-02 MED ORDER — COLLAGENASE 250 UNIT/GM EX OINT
TOPICAL_OINTMENT | Freq: Every day | CUTANEOUS | 0 refills | Status: AC
Start: 2017-08-03 — End: ?

## 2017-08-02 MED ORDER — TRAMADOL HCL 50 MG PO TABS: 50.0000 mg | ORAL_TABLET | Freq: Four times a day (QID) | ORAL | 1 refills | Status: AC | PRN

## 2017-08-02 MED FILL — SANTYL OINTMENT: 250 | 10 days supply | Qty: 30 | Fill #0

## 2017-08-02 MED FILL — CIPROFLOXACIN HCL 500 MG TA: 500 | 7 days supply | Qty: 14 | Fill #0

## 2017-08-02 MED FILL — traMADol HCL 50 MG TABS: 50 | 5 days supply | Qty: 20 | Fill #0

## 2017-08-02 NOTE — Progress Notes (Signed)
IVs dc'd. [facility] arrived to pick up patient. DC papers and prescriptions discussed with patient and mother over phone. Papers and prescriptions carried by [facility]. Patient and mother expressed understanding of DC paper work. Also updated about recent dressing change and foley catheter change.

## 2017-08-02 NOTE — Progress Notes (Signed)
Foley changed prior to discharge per order. Placement date written on drainage bag.

## 2017-08-02 NOTE — Progress Notes (Signed)
[id number]/Rhonda Davis,BSN, RN3, [phone]/TCT=-MOTHER -HAS THERAPIES AND AN RN IN PLACE AND WILL LET THEM KNOW THAT HIS IS COMING COMING.  Will go home via ptar.

## 2017-08-02 NOTE — Discharge Instructions (Signed)
Catheter-Associated Urinary Tract Infection FAQs  What is "catheter-associated urinary tract infection"?  A urinary tract infection (also called "UTI") is an infection in the urinary system, which includes the bladder (which stores the urine) and the kidneys (which filter the blood to make urine). Germs (for example, bacteria or yeasts) do not normally live in these areas; but if germs are introduced, an infection can occur.  If you have a urinary catheter, germs can travel along the catheter and cause an infection in your bladder or your kidney; in that case it is called a catheter-associated urinary tract infection (or "CA-UTI").  What is a urinary catheter?  A urinary catheter is a thin tube placed in the bladder to drain urine. Urine drains through the tube into a bag that collects the urine. A urinary catheter may be used:   If you are not able to urinate on your own   To measure the amount of urine that you make, for example, during intensive care   During and after some types of surgery   During some tests of the kidneys and bladder    People with urinary catheters have a much higher chance of getting a urinary tract infection than people who don't have a catheter.  How do I get a catheter-associated urinary tract infection (CA-UTI)?  If germs enter the urinary tract, they may cause an infection. Many of the germs that cause a catheter-associated urinary tract infection are common germs found in your intestines that do not usually cause an infection there. Germs can enter the urinary tract when the catheter is being put in or while the catheter remains in the bladder.  What are the symptoms of a urinary tract infection?  Some of the common symptoms of a urinary tract infection are:   Burning or pain in the lower abdomen (that is, below the stomach)   Fever   Bloody urine may be a sign of infection, but is also caused by other problems   Burning during urination or an increase in the frequency of  urination after the catheter is removed.    Sometimes people with catheter-associated urinary tract infections do not have these symptoms of infection.  Can catheter-associated urinary tract infections be treated?  Yes, most catheter-associated urinary tract infections can be treated with antibiotics and removal or change of the catheter. Your doctor will determine which antibiotic is best for you.  What are some of the things that hospitals are doing to prevent catheter-associated urinary tract infections?  To prevent urinary tract infections, doctors and nurses take the following actions.  Catheter insertion   Catheters are put in only when necessary and they are removed as soon as possible.   Only properly trained persons insert catheters using sterile ("clean") technique.   The skin in the area where the catheter will be inserted is cleaned before inserting the catheter.   Other methods to drain the urine are sometimes used, such as:  ? External catheters in men (these look like condoms and are placed over the penis rather than into the penis)  ? Putting a temporary catheter in to drain the urine and removing it right away. This is called intermittent urethral catheterization.    Catheter care   Healthcare providers clean their hands by washing them with soap and water or using an alcohol-based hand rub before and after touching your catheter.   If you do not see your providers clean their hands, please ask them to do so.     Avoid disconnecting the catheter and drain tube. This helps to prevent germs from getting into the catheter tube.   The catheter is secured to the leg to prevent pulling on the catheter.   Avoid twisting or kinking the catheter.   Keep the bag lower than the bladder to prevent urine from backflowing to the bladder.   Empty the bag regularly. The drainage spout should not touch anything while emptying the bag.    What can I do to help prevent catheter-associated urinary tract  infections if I have a catheter?   Always clean your hands before and after doing catheter care.   Always keep your urine bag below the level of your bladder.   Do not tug or pull on the tubing.   Do not twist or kink the catheter tubing.   Ask your healthcare provider each day if you still need the catheter.    What do I need to do when I go home from the hospital?   If you will be going home with a catheter, your doctor or nurse should explain everything you need to know about taking care of the catheter. Make sure you understand how to care for it before you leave the hospital.   If you develop any of the symptoms of a urinary tract infection, such as burning or pain in the lower abdomen, fever, or an increase in the frequency of urination, contact your doctor or nurse immediately.   Before you go home, make sure you know who to contact if you have questions or problems after you get home.  If you have questions, please ask your doctor or nurse.  Developed and co-sponsored by The Society for Healthcare Epidemiology of America (SHEA); Infectious Diseases Society of America (IDSA); American Hospital Association; Association for Professionals in Infection Control and Epidemiology (APIC); Centers for Disease Control and Prevention (CDC); and The Joint Commission.  This information is not intended to replace advice given to you by your health care provider. Make sure you discuss any questions you have with your health care provider.  Document Released: 05/15/2012 Document Revised: 02/02/2016 Document Reviewed: 11/04/2014  Elsevier Interactive Patient Education  2018 [employer]
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
## 2017-08-07 ENCOUNTER — Ambulatory Visit: Payer: Medicaid Other | Attending: Physical Medicine & Rehabilitation | Admitting: Occupational Therapy

## 2017-08-07 ENCOUNTER — Encounter: Payer: Self-pay | Admitting: Occupational Therapy

## 2017-08-07 DIAGNOSIS — G8221 Paraplegia, complete: Secondary | ICD-10-CM | POA: Diagnosis present

## 2017-08-07 DIAGNOSIS — M6281 Muscle weakness (generalized): Secondary | ICD-10-CM | POA: Insufficient documentation

## 2017-08-07 DIAGNOSIS — R293 Abnormal posture: Secondary | ICD-10-CM | POA: Diagnosis present
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
## 2017-08-07 NOTE — Patient Instructions (Signed)
Exercise program for your arms:  Do every day  1. Hold a 3 pound weight in each hand.  Start with your hands at your chest. Punch the weights out in front of you at the same time then back to your chest. Do 10, rest then do 10 more. This means you will do 20 total.   2. Hold a 3 pound weight in one hand (you can steady your balance with your other hand).  Start with the weight at your shoulder. Raise up toward the ceiling until arm is straight then lower back to shoulder.  Do 10 then do the other arm. Repeat - this means you will do 20 for each arm.  3. Work on your stretches in bed - legs straight out in front of you, reach for your toes and hold for a slow count of 5.  Sit back up. Do 10 Also do with your legs bent up in "circle." Do 10 of those as well.    Ask the nurse about how to position legs in bed to prevent further break down.

## 2017-08-09 ENCOUNTER — Other Ambulatory Visit: Payer: Self-pay | Admitting: Physical Medicine & Rehabilitation

## 2017-08-09 DIAGNOSIS — F329 Major depressive disorder, single episode, unspecified: Secondary | ICD-10-CM

## 2017-08-09 DIAGNOSIS — M792 Neuralgia and neuritis, unspecified: Secondary | ICD-10-CM

## 2017-08-09 MED FILL — VENLAFAXINE HCL 75 MG TAB: 75 | 30 days supply | Qty: 60 | Fill #0

## 2017-08-09 MED FILL — METHOCARBAMOL 500 MG TABS: 500 | 7 days supply | Qty: 30 | Fill #2

## 2017-08-10 ENCOUNTER — Encounter: Payer: Self-pay | Admitting: Family Medicine

## 2017-08-10 ENCOUNTER — Ambulatory Visit: Payer: Medicaid Other | Attending: Family Medicine | Admitting: Family Medicine

## 2017-08-10 ENCOUNTER — Encounter: Payer: Medicaid Other | Admitting: Psychology

## 2017-08-10 VITALS — BP 109/75 | HR 104 | Temp 97.5°F | Resp 18

## 2017-08-10 DIAGNOSIS — Z79899 Other long term (current) drug therapy: Secondary | ICD-10-CM | POA: Insufficient documentation

## 2017-08-10 DIAGNOSIS — S24104S Unspecified injury at T11-T12 level of thoracic spinal cord, sequela: Secondary | ICD-10-CM | POA: Insufficient documentation

## 2017-08-10 DIAGNOSIS — Z978 Presence of other specified devices: Secondary | ICD-10-CM

## 2017-08-10 DIAGNOSIS — Z79891 Long term (current) use of opiate analgesic: Secondary | ICD-10-CM | POA: Insufficient documentation

## 2017-08-10 DIAGNOSIS — I10 Essential (primary) hypertension: Secondary | ICD-10-CM | POA: Insufficient documentation

## 2017-08-10 DIAGNOSIS — F329 Major depressive disorder, single episode, unspecified: Secondary | ICD-10-CM | POA: Insufficient documentation

## 2017-08-10 DIAGNOSIS — F4310 Post-traumatic stress disorder, unspecified: Secondary | ICD-10-CM | POA: Insufficient documentation

## 2017-08-10 DIAGNOSIS — Z8659 Personal history of other mental and behavioral disorders: Secondary | ICD-10-CM | POA: Diagnosis not present

## 2017-08-10 DIAGNOSIS — Z09 Encounter for follow-up examination after completed treatment for conditions other than malignant neoplasm: Secondary | ICD-10-CM | POA: Diagnosis not present

## 2017-08-10 DIAGNOSIS — Z5189 Encounter for other specified aftercare: Secondary | ICD-10-CM | POA: Insufficient documentation

## 2017-08-10 DIAGNOSIS — X58XXXS Exposure to other specified factors, sequela: Secondary | ICD-10-CM | POA: Diagnosis not present

## 2017-08-10 DIAGNOSIS — Z9289 Personal history of other medical treatment: Secondary | ICD-10-CM | POA: Diagnosis not present

## 2017-08-10 DIAGNOSIS — Z960 Presence of urogenital implants: Secondary | ICD-10-CM

## 2017-08-10 MED ORDER — METHOCARBAMOL 500 MG PO TABS: 500.0000 mg | ORAL_TABLET | Freq: Four times a day (QID) | ORAL | 2 refills | Status: AC | PRN

## 2017-08-10 MED FILL — METHOCARBAMOL 500 MG TABS: 500 | 8 days supply | Qty: 30 | Fill #0

## 2017-08-10 NOTE — Patient Instructions (Addendum)
Come back after antibiotic course is complete for lab only visit.     Indwelling Urinary Catheter Care, Adult Take good care of your catheter to keep it working and to prevent problems. How to wear your catheter Attach your catheter to your leg with tape (adhesive tape) or a leg strap. Make sure it is not too tight. If you use tape, remove any bits of tape that are already on the catheter. How to wear a drainage bag You should have:  A large overnight bag.  A small leg bag.  Overnight Bag You may wear the overnight bag at any time. Always keep the bag below the level of your bladder but off the floor. When you sleep, put a clean plastic bag in a wastebasket. Then hang the bag inside the wastebasket. Leg Bag Never wear the leg bag at night. Always wear the leg bag below your knee. Keep the leg bag secure with a leg strap or tape. How to care for your skin  Clean the skin around the catheter at least once every day.  Shower every day. Do not take baths.  Put creams, lotions, or ointments on your genital area only as told by your doctor.  Do not use powders, sprays, or lotions on your genital area. How to clean your catheter and your skin 1. Wash your hands with soap and water. 2. Wet a washcloth in warm water and gentle (mild) soap. 3. Use the washcloth to clean the skin where the catheter enters your body. Clean downward and wipe away from the catheter in small circles. Do not wipe toward the catheter. 4. Pat the area dry with a clean towel. Make sure to clean off all soap. How to care for your drainage bags Empty your drainage bag when it is ?- full or at least 2-3 times a day. Replace your drainage bag once a month or sooner if it starts to smell bad or look dirty. Do not clean your drainage bag unless told by your doctor. Emptying a drainage bag  Supplies Needed  Rubbing alcohol.  Gauze pad or cotton ball.  Tape or a leg strap.  Steps 1. Wash your hands with soap and  water. 2. Separate (detach) the bag from your leg. 3. Hold the bag over the toilet or a clean container. Keep the bag below your hips and bladder. This stops pee (urine) from going back into the tube. 4. Open the pour spout at the bottom of the bag. 5. Empty the pee into the toilet or container. Do not let the pour spout touch any surface. 6. Put rubbing alcohol on a gauze pad or cotton ball. 7. Use the gauze pad or cotton ball to clean the pour spout. 8. Close the pour spout. 9. Attach the bag to your leg with tape or a leg strap. 10. Wash your hands.  Changing a drainage bag Supplies Needed  Alcohol wipes.  A clean drainage bag.  Adhesive tape or a leg strap.  Steps 1. Wash your hands with soap and water. 2. Separate the dirty bag from your leg. 3. Pinch the rubber catheter with your fingers so that pee does not spill out. 4. Separate the catheter tube from the drainage tube where these tubes connect (at the connection valve). Do not let the tubes touch any surface. 5. Clean the end of the catheter tube with an alcohol wipe. Use a different alcohol wipe to clean the end of the drainage tube. 6. Connect the catheter  tube to the drainage tube of the clean bag. 7. Attach the new bag to the leg with adhesive tape or a leg strap. 8. Wash your hands.  How to prevent infection and other problems  Never pull on your catheter or try to remove it. Pulling can damage tissue in your body.  Always wash your hands before and after touching your catheter.  If a leg strap gets wet, replace it with a dry one.  Drink enough fluids to keep your pee clear or pale yellow, or as told by your doctor.  Do not let the drainage bag or tubing touch the floor.  Wear cotton underwear.  If you are male, wipe from front to back after you poop (have a bowel movement).  Check on the catheter often to make sure it works and the tubing is not twisted. Get help if:  Your pee is cloudy.  Your pee  smells unusually bad.  Your pee is not draining into the bag.  Your tube gets clogged.  Your catheter starts to leak.  Your bladder feels full. Get help right away if:  You have redness, swelling, or pain where the catheter enters your body.  You have fluid, pus, or a bad smell coming from the area where the catheter enters your body.  The area where the catheter enters your body feels warm.  You have a fever.  You have pain in your: ? Stomach (abdomen). ? Legs. ? Lower back. ? Bladder.  You see blood fill the catheter.  Your pee is pink or red.  You feel sick to your stomach (nauseous).  You throw up (vomit).  You have chills.  Your catheter gets pulled out. This information is not intended to replace advice given to you by your health care provider. Make sure you discuss any questions you have with your health care provider. Document Released: 12/16/2012 Document Revised: 07/19/2016 Document Reviewed: 02/03/2014 Elsevier Interactive Patient Education  [employer]2018 [employer]

## 2017-08-10 NOTE — Progress Notes (Signed)
Patient is here for f/up   Patient complains leg pain
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
## 2017-08-14 ENCOUNTER — Ambulatory Visit: Payer: Medicaid Other | Admitting: Occupational Therapy

## 2017-08-16 ENCOUNTER — Encounter: Payer: Medicaid Other | Attending: Physical Medicine & Rehabilitation | Admitting: Psychology

## 2017-08-16 DIAGNOSIS — G8220 Paraplegia, unspecified: Secondary | ICD-10-CM | POA: Diagnosis not present

## 2017-08-16 DIAGNOSIS — S24104S Unspecified injury at T11-T12 level of thoracic spinal cord, sequela: Secondary | ICD-10-CM | POA: Diagnosis not present

## 2017-08-16 DIAGNOSIS — F329 Major depressive disorder, single episode, unspecified: Secondary | ICD-10-CM | POA: Diagnosis not present

## 2017-08-16 DIAGNOSIS — F17210 Nicotine dependence, cigarettes, uncomplicated: Secondary | ICD-10-CM | POA: Diagnosis not present

## 2017-08-16 DIAGNOSIS — F4310 Post-traumatic stress disorder, unspecified: Secondary | ICD-10-CM

## 2017-08-16 DIAGNOSIS — T148XXA Other injury of unspecified body region, initial encounter: Secondary | ICD-10-CM | POA: Diagnosis present

## 2017-08-16 DIAGNOSIS — L988 Other specified disorders of the skin and subcutaneous tissue: Secondary | ICD-10-CM | POA: Insufficient documentation

## 2017-08-16 DIAGNOSIS — M79605 Pain in left leg: Secondary | ICD-10-CM | POA: Insufficient documentation

## 2017-08-16 DIAGNOSIS — M79604 Pain in right leg: Secondary | ICD-10-CM | POA: Insufficient documentation

## 2017-08-17 ENCOUNTER — Encounter: Payer: Self-pay | Admitting: Psychology
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
## 2017-08-22 ENCOUNTER — Ambulatory Visit: Payer: Medicaid Other | Attending: Family Medicine

## 2017-08-22 DIAGNOSIS — Z09 Encounter for follow-up examination after completed treatment for conditions other than malignant neoplasm: Secondary | ICD-10-CM

## 2017-08-22 DIAGNOSIS — Z960 Presence of urogenital implants: Secondary | ICD-10-CM

## 2017-08-22 DIAGNOSIS — Z978 Presence of other specified devices: Secondary | ICD-10-CM

## 2017-08-23 ENCOUNTER — Encounter: Payer: Medicaid Other | Admitting: Psychology

## 2017-08-23 ENCOUNTER — Other Ambulatory Visit: Payer: Self-pay | Admitting: Physical Medicine & Rehabilitation

## 2017-08-23 DIAGNOSIS — G8220 Paraplegia, unspecified: Secondary | ICD-10-CM | POA: Diagnosis not present

## 2017-08-23 DIAGNOSIS — S24104S Unspecified injury at T11-T12 level of thoracic spinal cord, sequela: Secondary | ICD-10-CM | POA: Diagnosis not present

## 2017-08-23 DIAGNOSIS — G8918 Other acute postprocedural pain: Secondary | ICD-10-CM | POA: Diagnosis not present

## 2017-08-23 DIAGNOSIS — F4310 Post-traumatic stress disorder, unspecified: Secondary | ICD-10-CM | POA: Diagnosis not present

## 2017-08-23 MED FILL — GABAPENTIN 300 MG CAPSULE: 300 | 30 days supply | Qty: 180 | Fill #2

## 2017-08-23 MED FILL — NORTRIPTYLINE HCL 75 MG CAP: 75 | 30 days supply | Qty: 30 | Fill #0

## 2017-08-23 MED FILL — NADOLOL 20 MG TAB: 20 | 30 days supply | Qty: 30 | Fill #1

## 2017-08-24 ENCOUNTER — Other Ambulatory Visit: Payer: Self-pay | Admitting: Family Medicine

## 2017-08-24 DIAGNOSIS — Z978 Presence of other specified devices: Secondary | ICD-10-CM

## 2017-08-24 DIAGNOSIS — B9620 Unspecified Escherichia coli [E. coli] as the cause of diseases classified elsewhere: Secondary | ICD-10-CM

## 2017-08-24 DIAGNOSIS — Z960 Presence of urogenital implants: Secondary | ICD-10-CM

## 2017-08-24 DIAGNOSIS — N390 Urinary tract infection, site not specified: Secondary | ICD-10-CM

## 2017-08-24 LAB — URINE CULTURE

## 2017-08-24 MED ORDER — NITROFURANTOIN MONOHYD MACRO 100 MG PO CAPS
100.0000 mg | ORAL_CAPSULE | Freq: Two times a day (BID) | ORAL | 0 refills | Status: AC
Start: 2017-08-24 — End: ?

## 2017-08-24 MED FILL — NITROFURANTOIN MONO-MCR 100: 100 | 5 days supply | Qty: 10 | Fill #0

## 2017-08-31 ENCOUNTER — Emergency Department (HOSPITAL_COMMUNITY)
Admission: EM | Admit: 2017-08-31 | Discharge: 2017-08-31 | Disposition: A | Discharge status: Home / Self Care | Payer: Medicaid Other | Attending: Emergency Medicine | Admitting: Emergency Medicine

## 2017-08-31 ENCOUNTER — Other Ambulatory Visit: Payer: Self-pay

## 2017-08-31 ENCOUNTER — Encounter (HOSPITAL_COMMUNITY): Payer: Self-pay

## 2017-08-31 DIAGNOSIS — T83021A Displacement of indwelling urethral catheter, initial encounter: Secondary | ICD-10-CM

## 2017-08-31 DIAGNOSIS — I10 Essential (primary) hypertension: Secondary | ICD-10-CM | POA: Insufficient documentation

## 2017-08-31 DIAGNOSIS — G8220 Paraplegia, unspecified: Secondary | ICD-10-CM | POA: Insufficient documentation

## 2017-08-31 DIAGNOSIS — T83098A Other mechanical complication of other indwelling urethral catheter, initial encounter: Secondary | ICD-10-CM | POA: Diagnosis not present

## 2017-08-31 DIAGNOSIS — Y69 Unspecified misadventure during surgical and medical care: Secondary | ICD-10-CM | POA: Insufficient documentation

## 2017-08-31 DIAGNOSIS — L89612 Pressure ulcer of right heel, stage 2: Secondary | ICD-10-CM | POA: Insufficient documentation

## 2017-08-31 DIAGNOSIS — Z87891 Personal history of nicotine dependence: Secondary | ICD-10-CM | POA: Diagnosis not present

## 2017-08-31 DIAGNOSIS — Z79899 Other long term (current) drug therapy: Secondary | ICD-10-CM | POA: Insufficient documentation

## 2017-08-31 MED ORDER — BACITRACIN ZINC 500 UNIT/GM EX OINT
TOPICAL_OINTMENT | CUTANEOUS | Status: AC
  Filled 2017-08-31: qty 1.8

## 2017-08-31 MED ORDER — SULFAMETHOXAZOLE-TRIMETHOPRIM 800-160 MG PO TABS: 1.0000 | ORAL_TABLET | Freq: Once | ORAL | Status: DC

## 2017-08-31 NOTE — ED Notes (Signed)
[facility] called for transportation  

## 2017-08-31 NOTE — Discharge Instructions (Signed)
Call Dr. Vernie Ammonsttelin or return if you have any problems with your Foley catheter.  Have your home health nurse check the wound on your right heel next week. Return if concerned for any reason

## 2017-08-31 NOTE — ED Triage Notes (Signed)
Patient c/o burning and stinging of the urethra. patient states he is currently being treated for a UTI. Patient has the foley in a grocery bag and it does have a piece of the cath off.

## 2017-08-31 NOTE — ED Notes (Signed)
Bed: WA21 Expected date:  Expected time:  Means of arrival:  Comments: 26 yo m foley cath replacement

## 2017-08-31 NOTE — ED Triage Notes (Signed)
Per EMS- patient is from home. Patient's foley cath came out during the night and appears from the old cath that the tip of the catheter is missing. Patient is currently being treated for a UTI.
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
## 2017-09-01 LAB — URINE CULTURE

## 2017-09-05 MED FILL — VENLAFAXINE HCL 75 MG TAB: 75 | 30 days supply | Qty: 60 | Fill #1

## 2017-09-05 MED FILL — METHOCARBAMOL 500 MG TABS: 500 | 7 days supply | Qty: 30 | Fill #3

## 2017-09-06 ENCOUNTER — Encounter: Payer: Self-pay | Admitting: Psychology

## 2017-09-06 ENCOUNTER — Encounter: Payer: Medicaid Other | Attending: Physical Medicine & Rehabilitation | Admitting: Psychology

## 2017-09-06 DIAGNOSIS — T148XXA Other injury of unspecified body region, initial encounter: Secondary | ICD-10-CM | POA: Diagnosis present

## 2017-09-06 DIAGNOSIS — L988 Other specified disorders of the skin and subcutaneous tissue: Secondary | ICD-10-CM | POA: Insufficient documentation

## 2017-09-06 DIAGNOSIS — M79605 Pain in left leg: Secondary | ICD-10-CM | POA: Diagnosis not present

## 2017-09-06 DIAGNOSIS — S24104S Unspecified injury at T11-T12 level of thoracic spinal cord, sequela: Secondary | ICD-10-CM | POA: Diagnosis not present

## 2017-09-06 DIAGNOSIS — F4310 Post-traumatic stress disorder, unspecified: Secondary | ICD-10-CM

## 2017-09-06 DIAGNOSIS — G8220 Paraplegia, unspecified: Secondary | ICD-10-CM

## 2017-09-06 DIAGNOSIS — F17210 Nicotine dependence, cigarettes, uncomplicated: Secondary | ICD-10-CM | POA: Diagnosis not present

## 2017-09-06 DIAGNOSIS — M79604 Pain in right leg: Secondary | ICD-10-CM | POA: Diagnosis not present

## 2017-09-06 DIAGNOSIS — F329 Major depressive disorder, single episode, unspecified: Secondary | ICD-10-CM | POA: Diagnosis not present
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
## 2017-09-07 ENCOUNTER — Ambulatory Visit: Payer: Medicaid Other | Admitting: Occupational Therapy

## 2017-09-07 DIAGNOSIS — M6281 Muscle weakness (generalized): Secondary | ICD-10-CM | POA: Insufficient documentation

## 2017-09-07 DIAGNOSIS — G8221 Paraplegia, complete: Secondary | ICD-10-CM | POA: Insufficient documentation

## 2017-09-07 DIAGNOSIS — R293 Abnormal posture: Secondary | ICD-10-CM | POA: Insufficient documentation
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
## 2017-09-07 NOTE — Patient Instructions (Signed)
Note:  Craig Calderon came to us not doing any UE exercises at all and with very poor core strength.  This is why we started with a 3 pound weight (low weight with high repetition).  I had hoped to quickly ramp him up to more weight but this is the first time he as returned to outpatient since I gave him these exercises. He will need to ramp up in order to protect the integrity of his shoulders which are at greater risk due to using his UE's for mobiliyt.  Exercise program for your arms:  Do every day.  1. Hold a 3 pound weight in each hand.  Start with your hands at your chest. Punch the weights out in front of you at the same time then back to your chest. Do 10, rest then do 10 more. This means you will do 20 total.   2. Hold a 3 pound weight in one hand (you can steady your balance with your other hand).  Start with the weight at your shoulder. Raise up toward the ceiling until arm is straight then lower back to shoulder.  Do 10 then do the other arm. Repeat - this means you will do 20 for each arm.  3. Work on your stretches in bed - legs straight out in front of you, reach for your toes and hold for a slow count of 5.  Sit back up. Do 10 Also do with your legs bent up in "circle." Do 10 of those as well.  Craig Calderon is also able to place the board for transfers and take it out once he transfers. He is also able to transfer with close supervision only. He should be doing this at home.  He needs assistance with his legs and we have been waiting for the breakdown to heal in order to work on this with him.  He is also been asked to work on rolling at home as well as going from long sit to supine. He was also given a stretching program - long sit and bend to touch toes, holding for a slow count of 5.  10 reps.  Circle sit, touching toes, holding for slow count of 5 , 10 reps.  He should be practicing getting into and out of long sit and circle sit (I.e. Manage his own legs and transitioning into and out of these  positions). He can do this with only supervision.    Ask the nurse about how to position legs in bed to prevent further break down.

## 2017-09-11 ENCOUNTER — Encounter: Payer: Medicaid Other | Admitting: Occupational Therapy

## 2017-09-12 ENCOUNTER — Encounter: Payer: Medicaid Other | Admitting: Physical Medicine & Rehabilitation

## 2017-09-12 ENCOUNTER — Encounter (HOSPITAL_COMMUNITY): Payer: Self-pay | Admitting: Emergency Medicine

## 2017-09-12 ENCOUNTER — Emergency Department (HOSPITAL_COMMUNITY)
Admission: EM | Admit: 2017-09-12 | Discharge: 2017-10-05 | Disposition: E | Payer: Medicaid Other | Attending: Emergency Medicine | Admitting: Emergency Medicine

## 2017-09-12 DIAGNOSIS — I469 Cardiac arrest, cause unspecified: Secondary | ICD-10-CM | POA: Diagnosis not present

## 2017-09-12 DIAGNOSIS — Z87891 Personal history of nicotine dependence: Secondary | ICD-10-CM | POA: Diagnosis not present

## 2017-09-12 DIAGNOSIS — Z79899 Other long term (current) drug therapy: Secondary | ICD-10-CM | POA: Insufficient documentation

## 2017-09-12 HISTORY — DX: Type 2 diabetes mellitus without complications: E11.9

## 2017-09-12 MED ORDER — DEXTROSE 5 % IV SOLN
INTRAVENOUS | Status: AC | PRN
  Administered 2017-09-12: 300 mg via INTRAVENOUS

## 2017-09-12 MED ORDER — EPINEPHRINE PF 1 MG/10ML IJ SOSY
PREFILLED_SYRINGE | INTRAMUSCULAR | Status: AC | PRN
  Administered 2017-09-12 (×4): 1 via INTRAVENOUS

## 2017-09-13 ENCOUNTER — Encounter: Payer: Medicaid Other | Admitting: Occupational Therapy

## 2017-09-14 MED FILL — Medication: Qty: 1 | Status: AC

## 2017-09-18 ENCOUNTER — Encounter: Payer: Medicaid Other | Admitting: Occupational Therapy

## 2017-09-20 ENCOUNTER — Encounter: Payer: Medicaid Other | Admitting: Occupational Therapy

## 2017-09-25 ENCOUNTER — Encounter: Payer: Medicaid Other | Admitting: Occupational Therapy

## 2017-09-26 ENCOUNTER — Encounter: Payer: Medicaid Other | Admitting: Occupational Therapy

## 2017-10-03 ENCOUNTER — Encounter: Payer: Medicaid Other | Admitting: Occupational Therapy

## 2017-10-04 ENCOUNTER — Encounter: Payer: Medicaid Other | Admitting: Occupational Therapy

## 2017-10-05 NOTE — Code Documentation (Signed)
Two rhythm strips from two different leads printed.

## 2017-10-05 NOTE — Code Documentation (Signed)
No pulse continue CPR with Samuel BoucheLucas. HR 120 on monitor

## 2017-10-05 NOTE — ED Notes (Signed)
Spoke with Angelene GiovanniRodger from [place] to report next of kin name and phone number

## 2017-10-05 NOTE — Code Documentation (Signed)
Patient time of death occurred at [id number]

## 2017-10-05 NOTE — ED Notes (Signed)
Father and sister at bedside while nurse and chaplin at bedside.

## 2017-10-05 NOTE — Code Documentation (Signed)
Organ procurement team notified.

## 2017-10-05 NOTE — Code Documentation (Signed)
No pulse HR 48 on monitor
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
## 2017-10-05 NOTE — Code Documentation (Signed)
No pulse continue CPR with Craig Calderon. HR 124 on monitor

## 2017-10-05 NOTE — Code Documentation (Signed)
Chaplain stated family has not arrived to hospital.
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
## 2017-10-05 NOTE — Code Documentation (Signed)
Chaplain at bedside waiting for family to arrive.
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
## 2017-10-05 NOTE — ED Notes (Signed)
Chaplain states parents arrived to hospital and will speak with them with Doctor.

## 2017-10-05 NOTE — Code Documentation (Signed)
Total amount of IV fluids 0.9 NS 800 ml by EMS and ED.

## 2017-10-05 NOTE — Code Documentation (Signed)
ED Doctor spoke with Medical Examiner and will be a Medical Examiner case.
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
## 2017-10-05 NOTE — Progress Notes (Signed)
Responded to page to support family of deceased patient. Provided listening, prayer, guidance,emotional and grief support. Supported Haematologiststaff and facilitated information sharing between staff and family.   2017-10-23 1523  Clinical Encounter Type  Visited With Patient;Health care provider  Visit Type Initial;Spiritual support;Death;ED  Referral From Nurse  Spiritual Encounters  Spiritual Needs Prayer;Emotional;Grief support  Stress Factors  Family Stress Factors Exhausted;Loss  Fae PippinWatlington, Bran Aldridge, Chaplain, [facility], Pager [phone] ,

## 2017-10-05 DEATH — deceased

## 2017-10-09 ENCOUNTER — Encounter: Payer: Medicaid Other | Admitting: Occupational Therapy

## 2017-10-11 ENCOUNTER — Encounter: Payer: Medicaid Other | Admitting: Occupational Therapy

## 2017-10-12 ENCOUNTER — Ambulatory Visit: Payer: Medicaid Other | Admitting: Psychology

## 2017-10-16 ENCOUNTER — Encounter: Payer: Medicaid Other | Admitting: Occupational Therapy

## 2017-10-23 ENCOUNTER — Encounter: Payer: Medicaid Other | Admitting: Occupational Therapy

## 2017-10-25 ENCOUNTER — Encounter: Payer: Medicaid Other | Admitting: Occupational Therapy

## 2017-10-30 ENCOUNTER — Encounter: Payer: Medicaid Other | Admitting: Occupational Therapy

## 2017-11-01 ENCOUNTER — Encounter: Payer: Medicaid Other | Admitting: Occupational Therapy

## 2017-11-08 ENCOUNTER — Ambulatory Visit: Payer: Medicaid Other | Admitting: Psychology

## 2018-05-15 IMAGING — DX DG CHEST 1V PORT
1 series · 1 of 1 positions shown · non-contrast
Comparison: 01/20/2017

CLINICAL DATA: Increased cough

EXAM:
PORTABLE CHEST 1 VIEW

[chest ap]
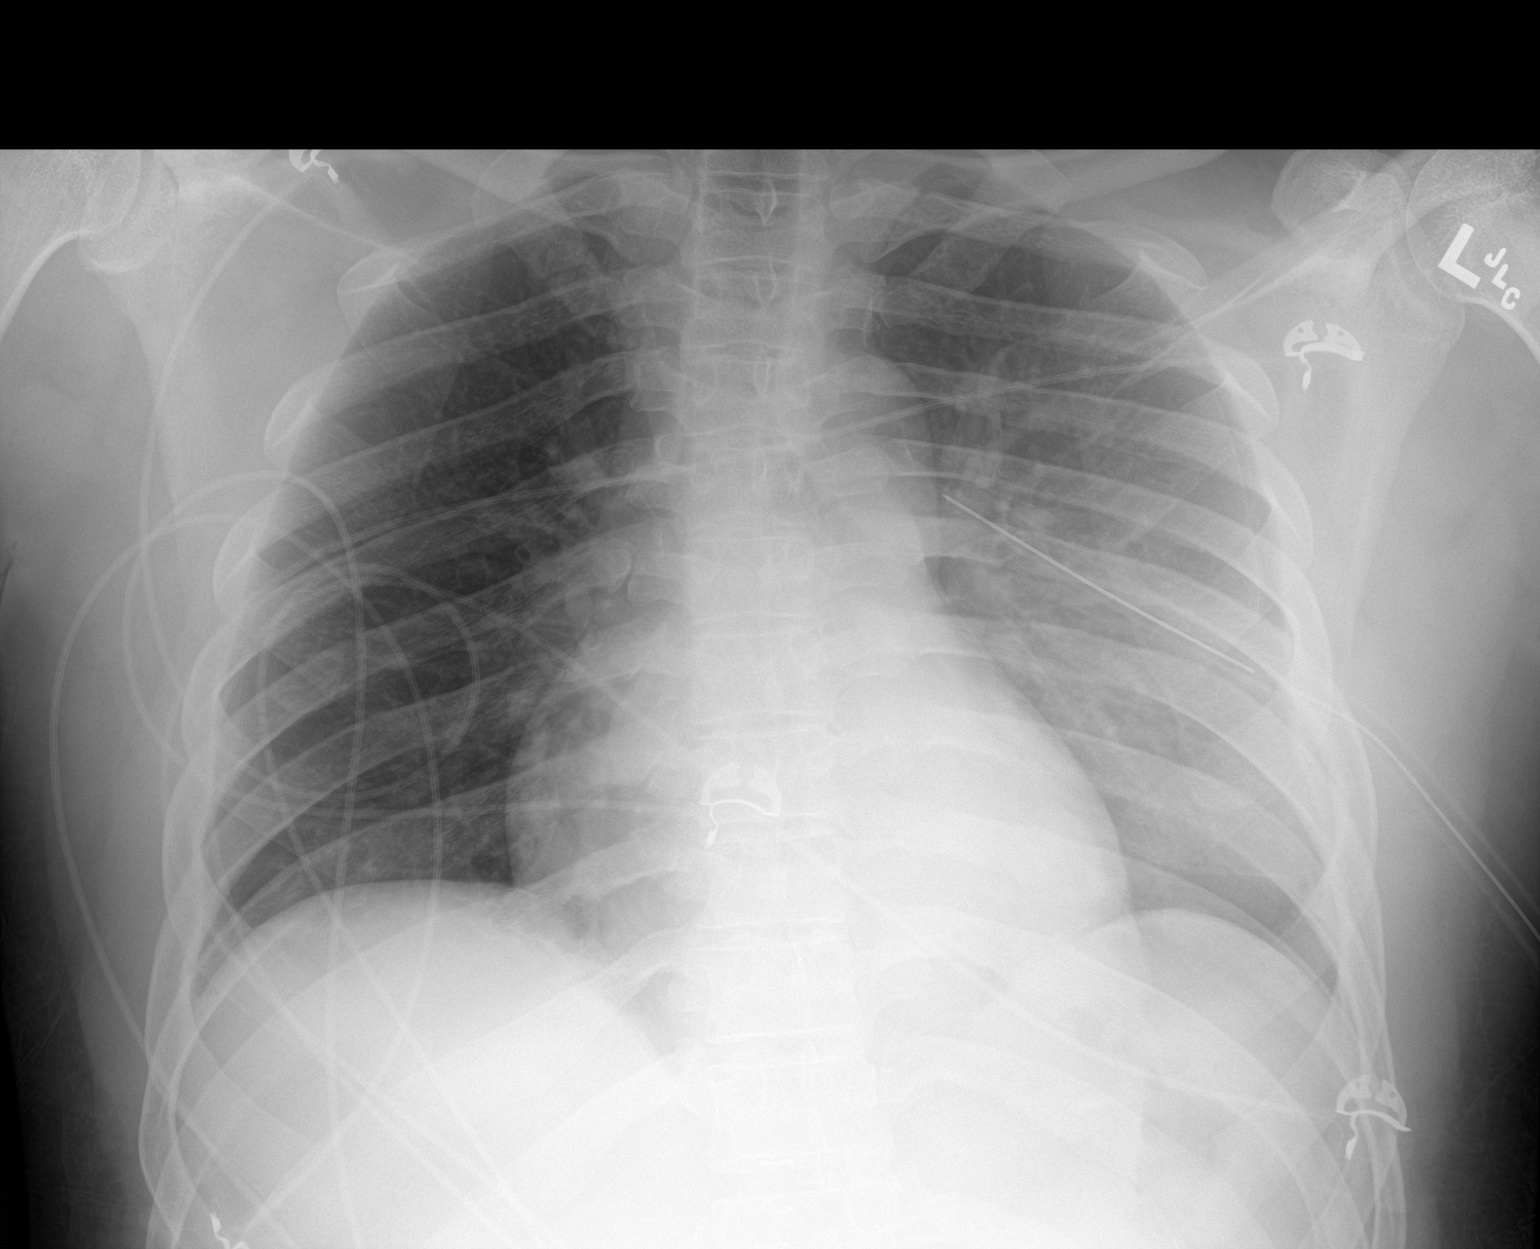

[1 of 1 positions shown; findings below may reference images not displayed]

FINDINGS: Left chest tube remains in place. There is a small left lateral and
basilar pneumothorax, increased slightly since prior study. Airspace
disease throughout the left lung, likely atelectasis. Right lung is
clear.
IMPRESSION: Small left pneumothorax with left chest tube remaining in place.
Pneumothorax has increased since prior study.

Left lung atelectasis.

## 2018-05-19 IMAGING — CR DG CHEST 2V
2 series · 2 of 2 positions shown · non-contrast
Comparison: 01/24/2017 and prior radiographs

CLINICAL DATA: Follow-up left pneumothorax/hemothorax. Shortness of
breath.

EXAM:
CHEST  2 VIEW

[chest lat]
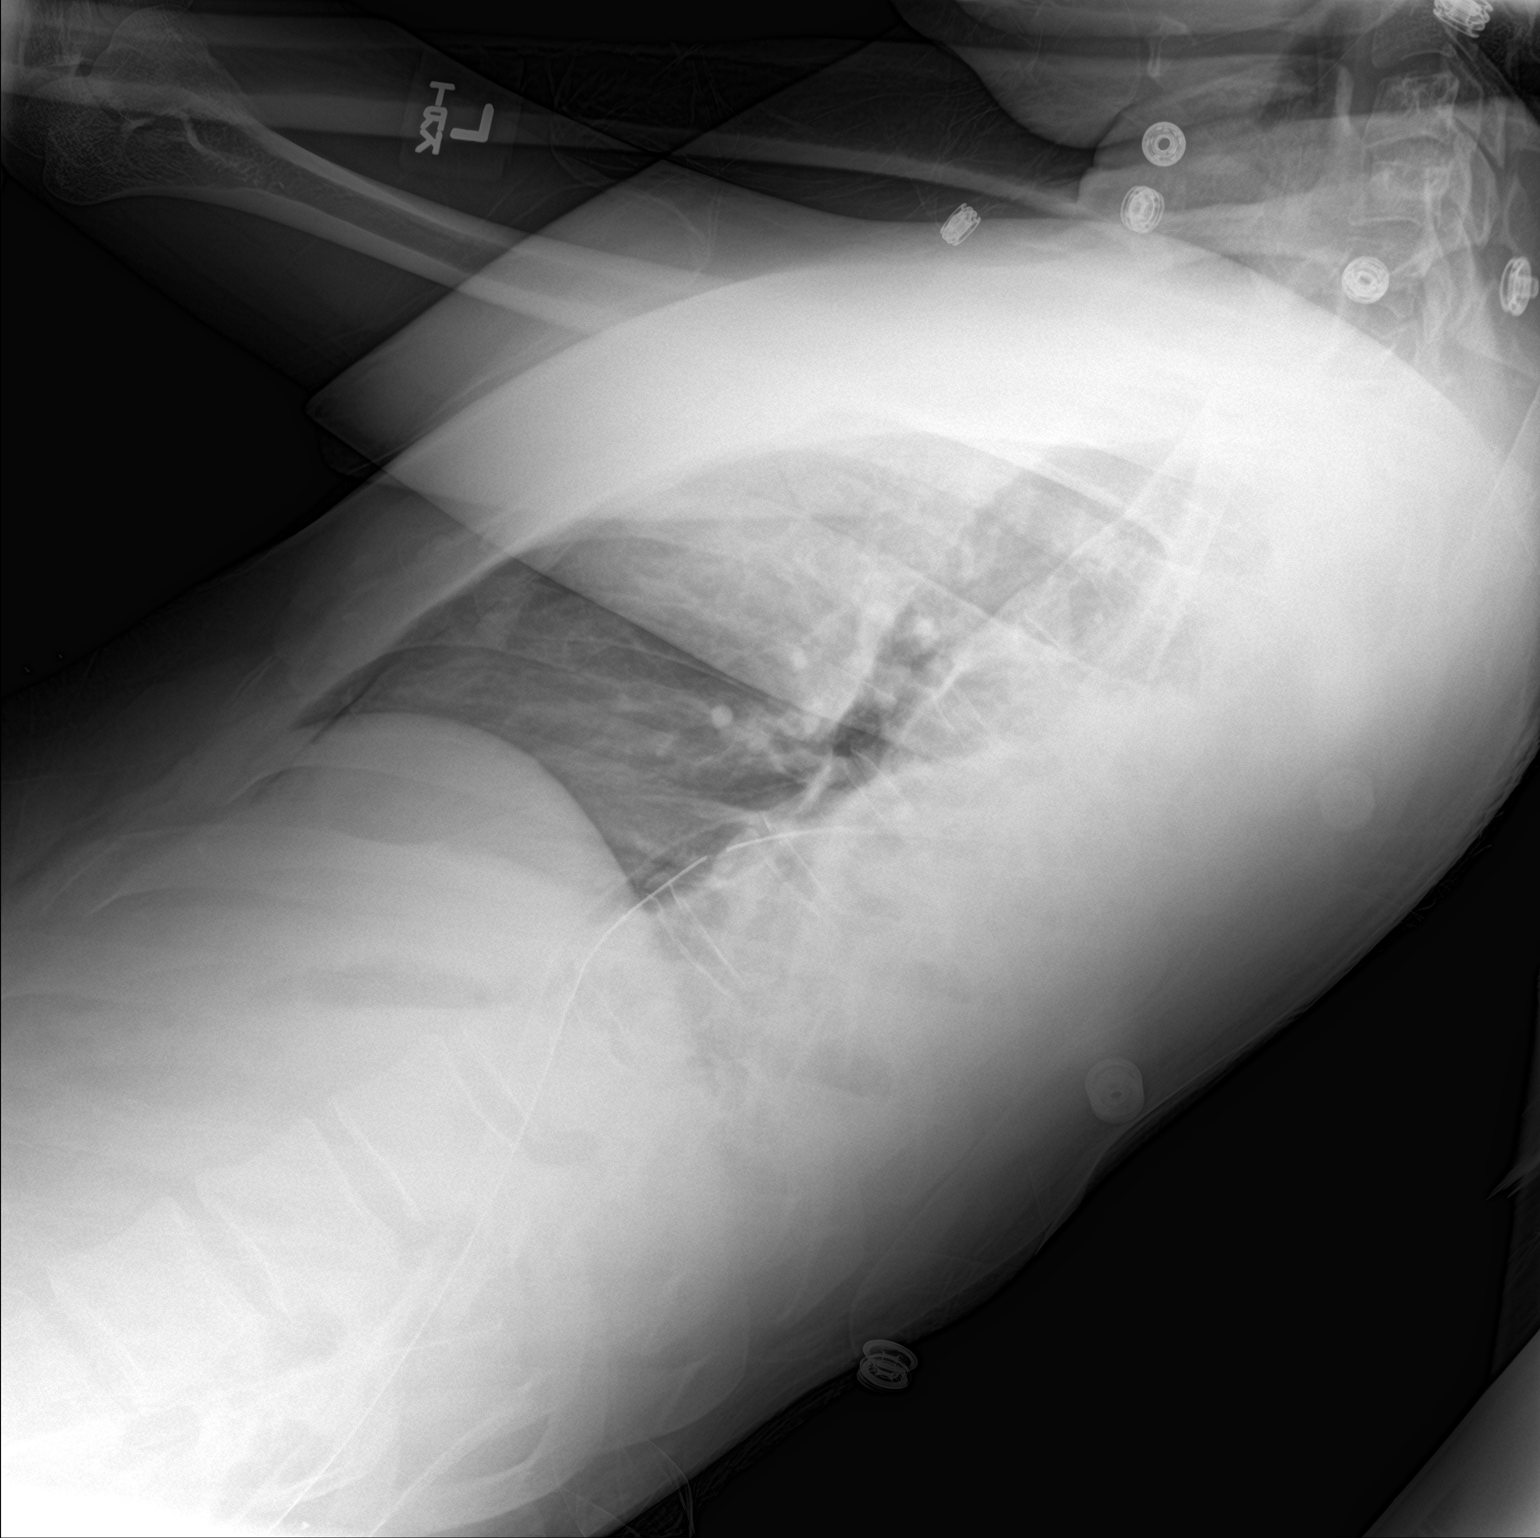

[chest ap]
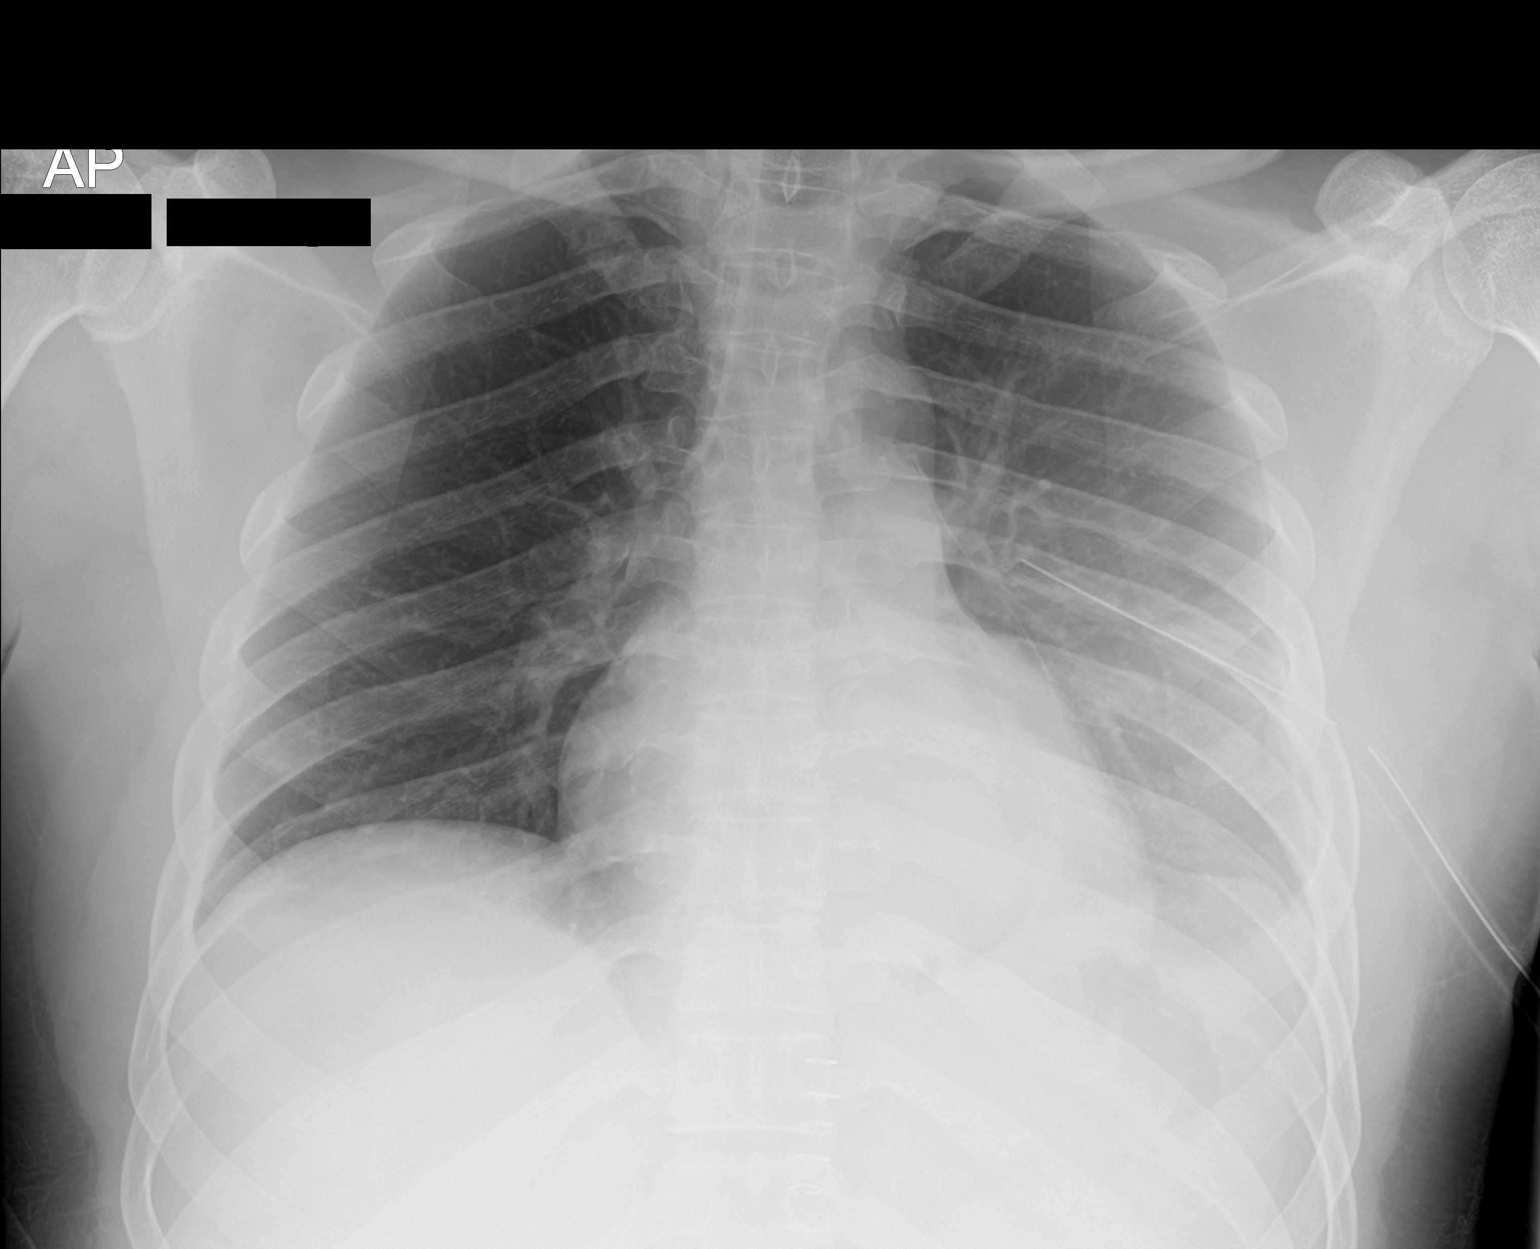

[2 of 2 positions shown; findings below may reference images not displayed]

FINDINGS: A left thoracostomy tube is again noted with side hole at the chest
wall.

A very small left medial and left lateral pneumothorax has decreased
in size.

Continued left lower lung opacity/ atelectasis noted.

No other changes identified.

The cardiomediastinal silhouette is unchanged.
IMPRESSION: Decreased very small left medial and lateral pneumothorax. Unchanged
left thoracostomy tube and left lower lung opacity/ atelectasis.

## 2018-05-20 IMAGING — DX DG CHEST 1V PORT
2 series · 2 of 2 positions shown · non-contrast
Comparison: 01/25/2017.

CLINICAL DATA: Left pneumothorax.

EXAM:
PORTABLE CHEST 1 VIEW

[chest ap (1 of 2)]
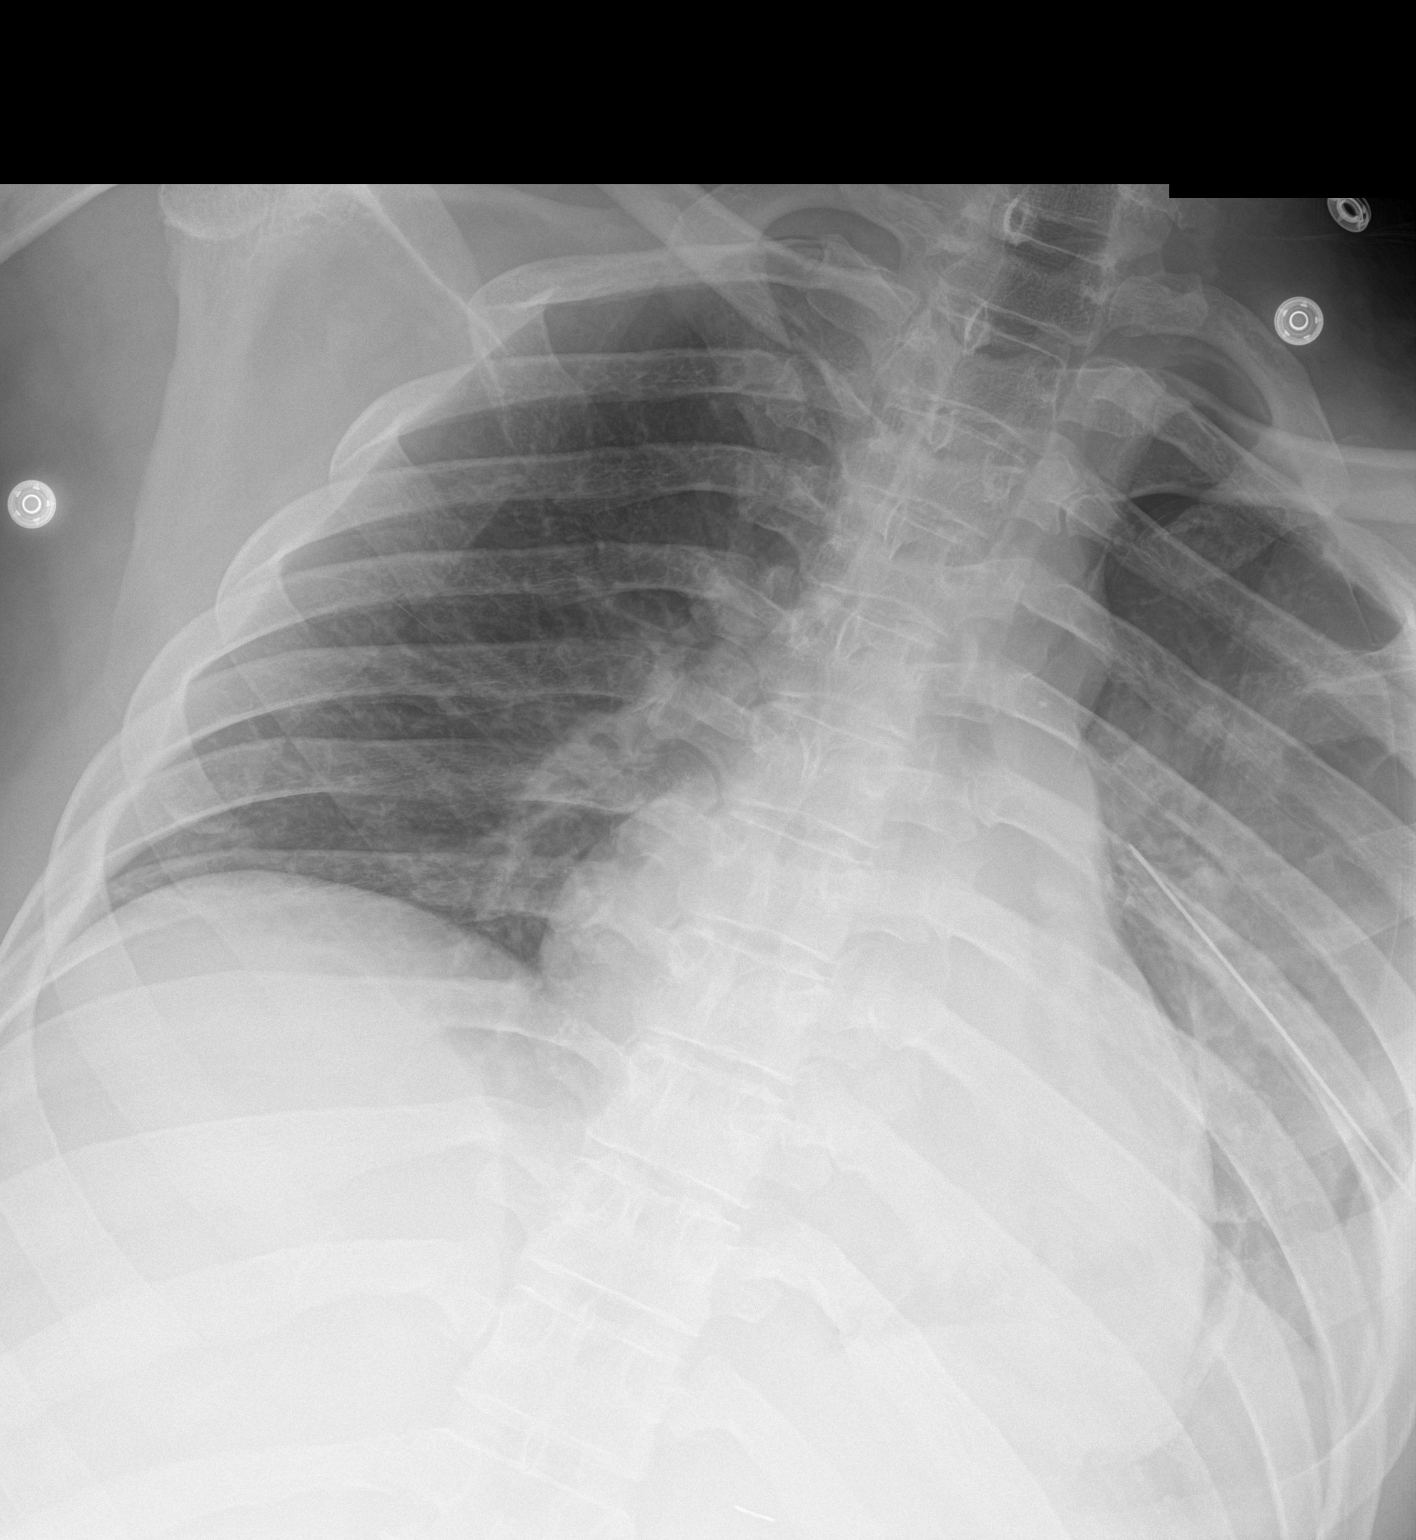

[chest ap (2 of 2)]
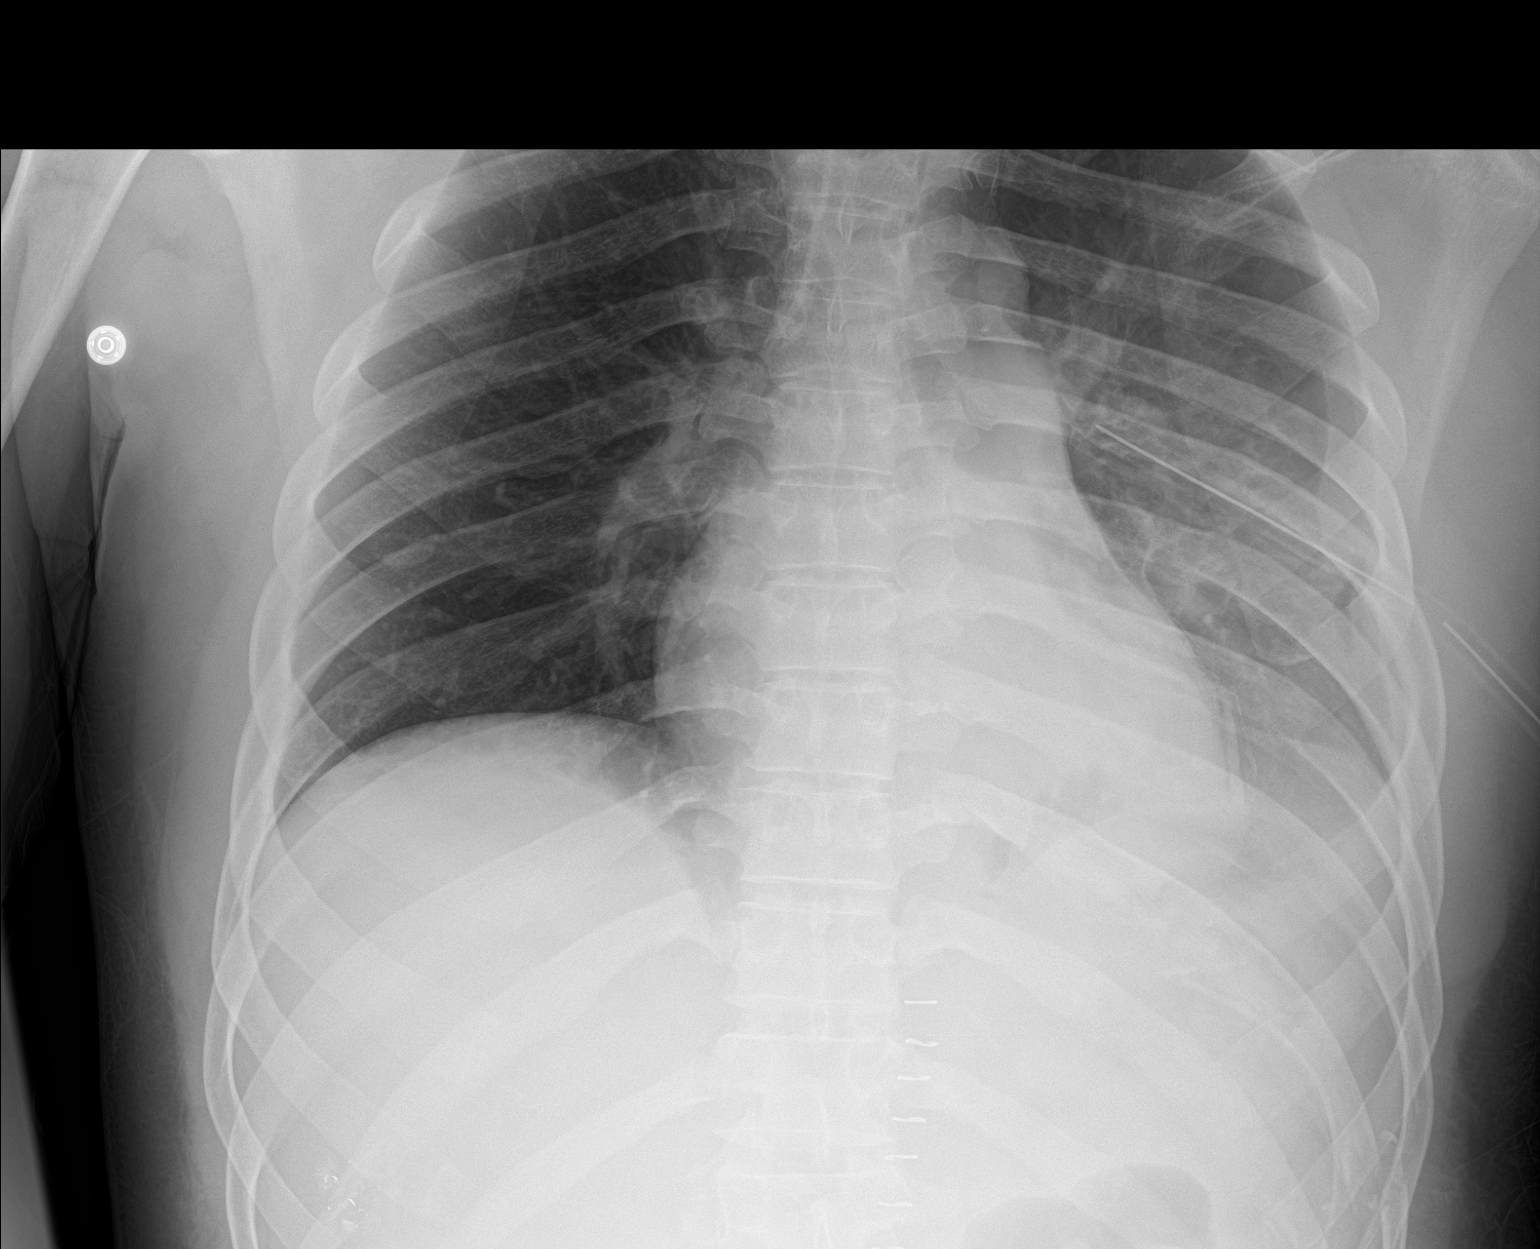

[2 of 2 positions shown; findings below may reference images not displayed]

FINDINGS: Left chest tube in stable position. Side hole is in the left chest
wall unchanged in position . Left-sided pneumothorax again noted and
is unchanged. Atelectatic changes left lung. Right lung is clear.
Stable cardiomegaly. a
IMPRESSION: Left chest tube in stable position. Again side-hole noted in the
left chest wall in unchanged position. Left-sided pneumothorax,
unchanged.

## 2018-12-14 NOTE — Telephone Encounter (Signed)
done
